# Patient Record
Sex: Female | Born: 1980 | Race: Black or African American | Hispanic: No | Marital: Single | Smoking: Never smoker
Health system: Southern US, Community
[De-identification: ages and names within clinical notes are randomized; demographics above are authoritative.]

## PROBLEM LIST (undated history)

## (undated) ENCOUNTER — Inpatient Hospital Stay (HOSPITAL_COMMUNITY): Payer: Self-pay

## (undated) DIAGNOSIS — R87629 Unspecified abnormal cytological findings in specimens from vagina: Secondary | ICD-10-CM

## (undated) DIAGNOSIS — N83209 Unspecified ovarian cyst, unspecified side: Secondary | ICD-10-CM

## (undated) DIAGNOSIS — O09529 Supervision of elderly multigravida, unspecified trimester: Secondary | ICD-10-CM

## (undated) DIAGNOSIS — N390 Urinary tract infection, site not specified: Secondary | ICD-10-CM

## (undated) DIAGNOSIS — D649 Anemia, unspecified: Secondary | ICD-10-CM

## (undated) DIAGNOSIS — T7491XA Unspecified adult maltreatment, confirmed, initial encounter: Secondary | ICD-10-CM

## (undated) HISTORY — PX: DILATION AND CURETTAGE OF UTERUS: SHX78

## (undated) HISTORY — DX: Unspecified adult maltreatment, confirmed, initial encounter: T74.91XA

## (undated) HISTORY — DX: Anemia, unspecified: D64.9

## (undated) HISTORY — DX: Unspecified abnormal cytological findings in specimens from vagina: R87.629

## (undated) HISTORY — DX: Supervision of elderly multigravida, unspecified trimester: O09.529

---
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
## 2000-03-11 ENCOUNTER — Encounter: Payer: Self-pay | Admitting: Emergency Medicine

## 2000-03-11 ENCOUNTER — Emergency Department (HOSPITAL_COMMUNITY): Admission: EM | Admit: 2000-03-11 | Discharge: 2000-03-11 | Payer: Self-pay | Admitting: Emergency Medicine

## 2001-02-21 ENCOUNTER — Emergency Department (HOSPITAL_COMMUNITY): Admission: EM | Admit: 2001-02-21 | Discharge: 2001-02-21 | Payer: Self-pay | Admitting: Emergency Medicine

## 2001-12-31 ENCOUNTER — Emergency Department (HOSPITAL_COMMUNITY): Admission: EM | Admit: 2001-12-31 | Discharge: 2001-12-31 | Payer: Self-pay | Admitting: Emergency Medicine

## 2002-04-26 ENCOUNTER — Emergency Department (HOSPITAL_COMMUNITY): Admission: EM | Admit: 2002-04-26 | Discharge: 2002-04-26 | Payer: Self-pay

## 2002-05-06 ENCOUNTER — Emergency Department (HOSPITAL_COMMUNITY): Admission: EM | Admit: 2002-05-06 | Discharge: 2002-05-06 | Payer: Self-pay | Admitting: *Deleted

## 2002-05-07 ENCOUNTER — Inpatient Hospital Stay (HOSPITAL_COMMUNITY): Admission: AD | Admit: 2002-05-07 | Discharge: 2002-05-07 | Payer: Self-pay | Admitting: *Deleted

## 2002-05-10 ENCOUNTER — Inpatient Hospital Stay (HOSPITAL_COMMUNITY): Admission: AD | Admit: 2002-05-10 | Discharge: 2002-05-10 | Payer: Self-pay | Admitting: *Deleted

## 2002-05-16 ENCOUNTER — Inpatient Hospital Stay (HOSPITAL_COMMUNITY): Admission: AD | Admit: 2002-05-16 | Discharge: 2002-05-16 | Payer: Self-pay | Admitting: *Deleted

## 2002-05-18 ENCOUNTER — Ambulatory Visit (HOSPITAL_COMMUNITY): Admission: RE | Admit: 2002-05-18 | Discharge: 2002-05-18 | Payer: Self-pay | Admitting: *Deleted

## 2002-05-29 ENCOUNTER — Encounter: Payer: Self-pay | Admitting: *Deleted

## 2002-05-29 ENCOUNTER — Inpatient Hospital Stay (HOSPITAL_COMMUNITY): Admission: AD | Admit: 2002-05-29 | Discharge: 2002-05-29 | Payer: Self-pay | Admitting: *Deleted

## 2002-06-14 ENCOUNTER — Inpatient Hospital Stay (HOSPITAL_COMMUNITY): Admission: AD | Admit: 2002-06-14 | Discharge: 2002-06-14 | Payer: Self-pay | Admitting: *Deleted

## 2002-07-11 ENCOUNTER — Inpatient Hospital Stay (HOSPITAL_COMMUNITY): Admission: AD | Admit: 2002-07-11 | Discharge: 2002-07-11 | Payer: Self-pay | Admitting: Obstetrics and Gynecology

## 2002-07-26 ENCOUNTER — Inpatient Hospital Stay (HOSPITAL_COMMUNITY): Admission: AD | Admit: 2002-07-26 | Discharge: 2002-07-26 | Payer: Self-pay | Admitting: *Deleted

## 2002-08-29 ENCOUNTER — Encounter: Payer: Self-pay | Admitting: *Deleted

## 2002-08-29 ENCOUNTER — Ambulatory Visit (HOSPITAL_COMMUNITY): Admission: RE | Admit: 2002-08-29 | Discharge: 2002-08-29 | Payer: Self-pay | Admitting: *Deleted

## 2002-09-10 ENCOUNTER — Inpatient Hospital Stay (HOSPITAL_COMMUNITY): Admission: AD | Admit: 2002-09-10 | Discharge: 2002-09-10 | Payer: Self-pay | Admitting: *Deleted

## 2002-10-01 ENCOUNTER — Inpatient Hospital Stay (HOSPITAL_COMMUNITY): Admission: AD | Admit: 2002-10-01 | Discharge: 2002-10-01 | Payer: Self-pay | Admitting: Obstetrics and Gynecology

## 2002-11-12 ENCOUNTER — Encounter: Payer: Self-pay | Admitting: Obstetrics and Gynecology

## 2002-11-12 ENCOUNTER — Ambulatory Visit (HOSPITAL_COMMUNITY): Admission: RE | Admit: 2002-11-12 | Discharge: 2002-11-12 | Payer: Self-pay | Admitting: Obstetrics and Gynecology

## 2002-12-09 ENCOUNTER — Inpatient Hospital Stay (HOSPITAL_COMMUNITY): Admission: AD | Admit: 2002-12-09 | Discharge: 2002-12-09 | Payer: Self-pay | Admitting: Obstetrics and Gynecology

## 2002-12-17 ENCOUNTER — Inpatient Hospital Stay: Admission: AD | Admit: 2002-12-17 | Discharge: 2002-12-17 | Payer: Self-pay | Admitting: Obstetrics and Gynecology

## 2003-01-11 ENCOUNTER — Inpatient Hospital Stay (HOSPITAL_COMMUNITY): Admission: AD | Admit: 2003-01-11 | Discharge: 2003-01-11 | Payer: Self-pay | Admitting: Obstetrics & Gynecology

## 2003-01-12 ENCOUNTER — Inpatient Hospital Stay (HOSPITAL_COMMUNITY): Admission: AD | Admit: 2003-01-12 | Discharge: 2003-01-14 | Payer: Self-pay | Admitting: Obstetrics & Gynecology

## 2003-03-14 ENCOUNTER — Inpatient Hospital Stay (HOSPITAL_COMMUNITY): Admission: AD | Admit: 2003-03-14 | Discharge: 2003-03-14 | Payer: Self-pay | Admitting: Obstetrics and Gynecology

## 2003-05-22 ENCOUNTER — Emergency Department (HOSPITAL_COMMUNITY): Admission: AD | Admit: 2003-05-22 | Discharge: 2003-05-22 | Payer: Self-pay | Admitting: Family Medicine

## 2003-06-21 ENCOUNTER — Inpatient Hospital Stay (HOSPITAL_COMMUNITY): Admission: AD | Admit: 2003-06-21 | Discharge: 2003-06-21 | Payer: Self-pay | Admitting: Obstetrics and Gynecology

## 2003-10-08 ENCOUNTER — Emergency Department (HOSPITAL_COMMUNITY): Admission: EM | Admit: 2003-10-08 | Discharge: 2003-10-08 | Payer: Self-pay | Admitting: Emergency Medicine

## 2003-10-11 ENCOUNTER — Inpatient Hospital Stay (HOSPITAL_COMMUNITY): Admission: AD | Admit: 2003-10-11 | Discharge: 2003-10-11 | Payer: Self-pay | Admitting: Obstetrics & Gynecology

## 2003-11-30 ENCOUNTER — Inpatient Hospital Stay (HOSPITAL_COMMUNITY): Admission: AD | Admit: 2003-11-30 | Discharge: 2003-11-30 | Payer: Self-pay | Admitting: Obstetrics and Gynecology

## 2003-11-30 ENCOUNTER — Encounter (INDEPENDENT_AMBULATORY_CARE_PROVIDER_SITE_OTHER): Payer: Self-pay | Admitting: Specialist

## 2003-11-30 ENCOUNTER — Observation Stay (HOSPITAL_COMMUNITY): Admission: AD | Admit: 2003-11-30 | Discharge: 2003-12-01 | Payer: Self-pay | Admitting: Family Medicine

## 2004-03-09 ENCOUNTER — Inpatient Hospital Stay (HOSPITAL_COMMUNITY): Admission: AD | Admit: 2004-03-09 | Discharge: 2004-03-10 | Payer: Self-pay | Admitting: Family Medicine

## 2004-05-13 ENCOUNTER — Inpatient Hospital Stay (HOSPITAL_COMMUNITY): Admission: AD | Admit: 2004-05-13 | Discharge: 2004-05-14 | Payer: Self-pay | Admitting: Obstetrics & Gynecology

## 2004-08-06 ENCOUNTER — Emergency Department (HOSPITAL_COMMUNITY): Admission: EM | Admit: 2004-08-06 | Discharge: 2004-08-06 | Payer: Self-pay | Admitting: Emergency Medicine

## 2005-05-10 ENCOUNTER — Inpatient Hospital Stay (HOSPITAL_COMMUNITY): Admission: AD | Admit: 2005-05-10 | Discharge: 2005-05-10 | Payer: Self-pay | Admitting: Obstetrics and Gynecology

## 2005-06-17 ENCOUNTER — Inpatient Hospital Stay (HOSPITAL_COMMUNITY): Admission: AD | Admit: 2005-06-17 | Discharge: 2005-06-17 | Payer: Self-pay | Admitting: Obstetrics & Gynecology

## 2005-06-18 ENCOUNTER — Encounter (INDEPENDENT_AMBULATORY_CARE_PROVIDER_SITE_OTHER): Payer: Self-pay | Admitting: *Deleted

## 2005-06-18 ENCOUNTER — Ambulatory Visit (HOSPITAL_COMMUNITY): Admission: AD | Admit: 2005-06-18 | Discharge: 2005-06-18 | Payer: Self-pay | Admitting: Obstetrics and Gynecology

## 2005-08-15 ENCOUNTER — Emergency Department (HOSPITAL_COMMUNITY): Admission: EM | Admit: 2005-08-15 | Discharge: 2005-08-15 | Payer: Self-pay | Admitting: Emergency Medicine

## 2005-08-25 ENCOUNTER — Emergency Department (HOSPITAL_COMMUNITY): Admission: EM | Admit: 2005-08-25 | Discharge: 2005-08-25 | Payer: Self-pay | Admitting: Emergency Medicine

## 2005-09-27 ENCOUNTER — Emergency Department (HOSPITAL_COMMUNITY): Admission: EM | Admit: 2005-09-27 | Discharge: 2005-09-27 | Payer: Self-pay | Admitting: Emergency Medicine

## 2005-10-29 ENCOUNTER — Emergency Department (HOSPITAL_COMMUNITY): Admission: EM | Admit: 2005-10-29 | Discharge: 2005-10-30 | Payer: Self-pay | Admitting: Emergency Medicine

## 2007-07-12 ENCOUNTER — Emergency Department (HOSPITAL_COMMUNITY): Admission: EM | Admit: 2007-07-12 | Discharge: 2007-07-12 | Payer: Self-pay | Admitting: Emergency Medicine

## 2008-04-26 ENCOUNTER — Emergency Department (HOSPITAL_COMMUNITY): Admission: EM | Admit: 2008-04-26 | Discharge: 2008-04-26 | Payer: Self-pay | Admitting: Family Medicine

## 2008-08-06 ENCOUNTER — Emergency Department (HOSPITAL_COMMUNITY): Admission: EM | Admit: 2008-08-06 | Discharge: 2008-08-06 | Payer: Self-pay | Admitting: Emergency Medicine

## 2008-12-30 ENCOUNTER — Emergency Department (HOSPITAL_COMMUNITY): Admission: EM | Admit: 2008-12-30 | Discharge: 2008-12-30 | Payer: Self-pay | Admitting: Emergency Medicine

## 2009-05-28 ENCOUNTER — Emergency Department (HOSPITAL_COMMUNITY): Admission: EM | Admit: 2009-05-28 | Discharge: 2009-05-28 | Payer: Self-pay | Admitting: Diagnostic Radiology

## 2009-08-26 ENCOUNTER — Inpatient Hospital Stay (HOSPITAL_COMMUNITY): Admission: AD | Admit: 2009-08-26 | Discharge: 2009-08-27 | Payer: Self-pay | Admitting: Obstetrics & Gynecology

## 2009-12-31 ENCOUNTER — Emergency Department (HOSPITAL_COMMUNITY): Admission: EM | Admit: 2009-12-31 | Discharge: 2009-12-31 | Payer: Self-pay | Admitting: Emergency Medicine

## 2010-03-12 ENCOUNTER — Inpatient Hospital Stay (HOSPITAL_COMMUNITY)
Admission: AD | Admit: 2010-03-12 | Discharge: 2010-03-12 | Payer: Self-pay | Source: Home / Self Care | Admitting: Family Medicine

## 2010-03-12 ENCOUNTER — Ambulatory Visit: Payer: Self-pay | Admitting: Obstetrics and Gynecology

## 2010-03-26 ENCOUNTER — Ambulatory Visit: Payer: Self-pay | Admitting: Gynecology

## 2010-03-26 ENCOUNTER — Inpatient Hospital Stay (HOSPITAL_COMMUNITY)
Admission: AD | Admit: 2010-03-26 | Discharge: 2010-03-26 | Payer: Self-pay | Source: Home / Self Care | Admitting: Obstetrics & Gynecology

## 2010-04-13 ENCOUNTER — Ambulatory Visit: Payer: Self-pay | Admitting: Obstetrics and Gynecology

## 2010-04-13 ENCOUNTER — Inpatient Hospital Stay (HOSPITAL_COMMUNITY)
Admission: AD | Admit: 2010-04-13 | Discharge: 2010-04-13 | Payer: Self-pay | Source: Home / Self Care | Admitting: Obstetrics & Gynecology

## 2010-04-16 ENCOUNTER — Ambulatory Visit: Payer: Self-pay | Admitting: Gynecology

## 2010-04-16 ENCOUNTER — Inpatient Hospital Stay (HOSPITAL_COMMUNITY)
Admission: AD | Admit: 2010-04-16 | Discharge: 2010-04-16 | Payer: Self-pay | Source: Home / Self Care | Admitting: Obstetrics and Gynecology

## 2010-04-25 ENCOUNTER — Inpatient Hospital Stay (HOSPITAL_COMMUNITY): Admission: AD | Admit: 2010-04-25 | Discharge: 2010-04-25 | Payer: Self-pay | Admitting: Obstetrics & Gynecology

## 2010-04-25 ENCOUNTER — Ambulatory Visit: Payer: Self-pay | Admitting: Obstetrics & Gynecology

## 2010-05-18 ENCOUNTER — Ambulatory Visit: Payer: Self-pay | Admitting: Family

## 2010-05-18 ENCOUNTER — Inpatient Hospital Stay (HOSPITAL_COMMUNITY): Admission: AD | Admit: 2010-05-18 | Discharge: 2010-05-18 | Payer: Self-pay | Admitting: Obstetrics & Gynecology

## 2010-07-03 ENCOUNTER — Ambulatory Visit (HOSPITAL_COMMUNITY)
Admission: RE | Admit: 2010-07-03 | Discharge: 2010-07-03 | Payer: Self-pay | Source: Home / Self Care | Attending: Family Medicine | Admitting: Family Medicine

## 2010-07-03 ENCOUNTER — Encounter: Payer: Self-pay | Admitting: Family Medicine

## 2010-08-06 ENCOUNTER — Inpatient Hospital Stay (HOSPITAL_COMMUNITY)
Admission: AD | Admit: 2010-08-06 | Discharge: 2010-08-06 | Payer: Self-pay | Source: Home / Self Care | Attending: Obstetrics and Gynecology | Admitting: Obstetrics and Gynecology

## 2010-08-07 ENCOUNTER — Inpatient Hospital Stay (HOSPITAL_COMMUNITY)
Admission: AD | Admit: 2010-08-07 | Discharge: 2010-08-07 | Payer: Self-pay | Source: Home / Self Care | Attending: Obstetrics & Gynecology | Admitting: Obstetrics & Gynecology

## 2010-08-10 LAB — WET PREP, GENITAL
Trich, Wet Prep: NONE SEEN
Yeast Wet Prep HPF POC: NONE SEEN

## 2010-08-10 LAB — URINALYSIS, ROUTINE W REFLEX MICROSCOPIC
Bilirubin Urine: NEGATIVE
Hgb urine dipstick: NEGATIVE
Ketones, ur: NEGATIVE mg/dL
Nitrite: NEGATIVE
Protein, ur: NEGATIVE mg/dL
Specific Gravity, Urine: <1.005 — ABNORMAL LOW (ref 1.005–1.030)
Urine Glucose, Fasting: NEGATIVE mg/dL
Urobilinogen, UA: 0.2 mg/dL (ref 0.0–1.0)
pH: 6 (ref 5.0–8.0)

## 2010-08-10 LAB — KLEIHAUER-BETKE STAIN
Fetal Cells %: 0 %
Quantitation Fetal Hemoglobin: 0 mL

## 2010-08-10 LAB — TYPE AND SCREEN
ABO/RH(D): O POS
Antibody Screen: NEGATIVE

## 2010-08-24 ENCOUNTER — Inpatient Hospital Stay (HOSPITAL_COMMUNITY)
Admission: AD | Admit: 2010-08-24 | Discharge: 2010-08-24 | Payer: Self-pay | Source: Home / Self Care | Attending: Family Medicine | Admitting: Family Medicine

## 2010-08-24 LAB — WET PREP, GENITAL
Clue Cells Wet Prep HPF POC: NONE SEEN
Trich, Wet Prep: NONE SEEN
Yeast Wet Prep HPF POC: NONE SEEN

## 2010-08-24 LAB — URINALYSIS, ROUTINE W REFLEX MICROSCOPIC
Bilirubin Urine: NEGATIVE
Hgb urine dipstick: NEGATIVE
Ketones, ur: NEGATIVE mg/dL
Nitrite: NEGATIVE
Protein, ur: NEGATIVE mg/dL
Specific Gravity, Urine: 1.02 (ref 1.005–1.030)
Urine Glucose, Fasting: NEGATIVE mg/dL
Urobilinogen, UA: 0.2 mg/dL (ref 0.0–1.0)
pH: 6.5 (ref 5.0–8.0)

## 2010-10-07 LAB — URINALYSIS, ROUTINE W REFLEX MICROSCOPIC
Bilirubin Urine: NEGATIVE
Hgb urine dipstick: NEGATIVE
Protein, ur: NEGATIVE mg/dL
Urobilinogen, UA: 0.2 mg/dL (ref 0.0–1.0)

## 2010-10-08 LAB — URINALYSIS, ROUTINE W REFLEX MICROSCOPIC
Glucose, UA: NEGATIVE mg/dL
Ketones, ur: NEGATIVE mg/dL
Leukocytes, UA: NEGATIVE
Nitrite: NEGATIVE
Protein, ur: NEGATIVE mg/dL
Specific Gravity, Urine: >1.03 — ABNORMAL HIGH (ref 1.005–1.030)
Urobilinogen, UA: 0.2 mg/dL (ref 0.0–1.0)
pH: 6 (ref 5.0–8.0)
pH: 6.5 (ref 5.0–8.0)

## 2010-10-08 LAB — URINE MICROSCOPIC-ADD ON

## 2010-10-08 LAB — COMPREHENSIVE METABOLIC PANEL
Alkaline Phosphatase: 41 U/L (ref 39–117)
BUN: 9 mg/dL (ref 6–23)
Glucose, Bld: 88 mg/dL (ref 70–99)
Potassium: 3.6 mEq/L (ref 3.5–5.1)
Total Protein: 6.9 g/dL (ref 6.0–8.3)

## 2010-10-08 LAB — CBC
HCT: 35.8 % — ABNORMAL LOW (ref 36.0–46.0)
MCHC: 33.1 g/dL (ref 30.0–36.0)
MCV: 81.4 fL (ref 78.0–100.0)
RDW: 16.1 % — ABNORMAL HIGH (ref 11.5–15.5)
WBC: 8.1 10*3/uL (ref 4.0–10.5)

## 2010-10-08 LAB — LIPASE, BLOOD: Lipase: 31 U/L (ref 11–59)

## 2010-10-08 LAB — WET PREP, GENITAL: Yeast Wet Prep HPF POC: NONE SEEN

## 2010-10-09 LAB — CBC
HCT: 33.8 % — ABNORMAL LOW (ref 36.0–46.0)
MCH: 26.1 pg (ref 26.0–34.0)
MCV: 83.2 fL (ref 78.0–100.0)
Platelets: 214 10*3/uL (ref 150–400)
RDW: 16.6 % — ABNORMAL HIGH (ref 11.5–15.5)

## 2010-10-09 LAB — GC/CHLAMYDIA PROBE AMP, GENITAL: GC Probe Amp, Genital: NEGATIVE

## 2010-10-09 LAB — WET PREP, GENITAL
Trich, Wet Prep: NONE SEEN
Yeast Wet Prep HPF POC: NONE SEEN

## 2010-10-09 LAB — URINALYSIS, ROUTINE W REFLEX MICROSCOPIC
Glucose, UA: NEGATIVE mg/dL
Hgb urine dipstick: NEGATIVE
Specific Gravity, Urine: <1.005 — ABNORMAL LOW (ref 1.005–1.030)
pH: 5.5 (ref 5.0–8.0)

## 2010-10-12 LAB — RPR: RPR Ser Ql: NONREACTIVE

## 2010-10-12 LAB — BASIC METABOLIC PANEL
BUN: 11 mg/dL (ref 6–23)
Creatinine, Ser: 0.88 mg/dL (ref 0.4–1.2)
GFR calc non Af Amer: >60 mL/min (ref >60)
Potassium: 3.5 mEq/L (ref 3.5–5.1)

## 2010-10-12 LAB — CBC
Platelets: 228 10*3/uL (ref 150–400)
WBC: 4.4 10*3/uL (ref 4.0–10.5)

## 2010-10-12 LAB — WET PREP, GENITAL: Clue Cells Wet Prep HPF POC: NONE SEEN

## 2010-10-12 LAB — URINALYSIS, ROUTINE W REFLEX MICROSCOPIC
Bilirubin Urine: NEGATIVE
Glucose, UA: NEGATIVE mg/dL
Protein, ur: NEGATIVE mg/dL
Specific Gravity, Urine: >1.03 — ABNORMAL HIGH (ref 1.005–1.030)
Urobilinogen, UA: 0.2 mg/dL (ref 0.0–1.0)

## 2010-10-12 LAB — URINE MICROSCOPIC-ADD ON

## 2010-10-12 LAB — GC/CHLAMYDIA PROBE AMP, GENITAL
Chlamydia, DNA Probe: NEGATIVE
GC Probe Amp, Genital: NEGATIVE

## 2010-10-12 LAB — DIFFERENTIAL
Lymphocytes Relative: 35 % (ref 12–46)
Lymphs Abs: 1.5 10*3/uL (ref 0.7–4.0)
Neutro Abs: 2.4 10*3/uL (ref 1.7–7.7)
Neutrophils Relative %: 55 % (ref 43–77)

## 2010-10-12 LAB — POCT PREGNANCY, URINE: Preg Test, Ur: NEGATIVE

## 2010-10-16 ENCOUNTER — Inpatient Hospital Stay (HOSPITAL_COMMUNITY)
Admission: AD | Admit: 2010-10-16 | Discharge: 2010-10-16 | Disposition: A | Discharge status: Home / Self Care | Payer: Medicaid Other | Source: Ambulatory Visit | Attending: Obstetrics & Gynecology | Admitting: Obstetrics & Gynecology

## 2010-10-16 DIAGNOSIS — O479 False labor, unspecified: Secondary | ICD-10-CM

## 2010-10-16 LAB — CBC
Hemoglobin: 10.1 g/dL — ABNORMAL LOW (ref 12.0–15.0)
MCHC: 33.2 g/dL (ref 30.0–36.0)
Platelets: 235 10*3/uL (ref 150–400)
RDW: 15.9 % — ABNORMAL HIGH (ref 11.5–15.5)

## 2010-10-16 LAB — URINE MICROSCOPIC-ADD ON

## 2010-10-16 LAB — URINALYSIS, ROUTINE W REFLEX MICROSCOPIC
Glucose, UA: NEGATIVE mg/dL
Glucose, UA: NEGATIVE mg/dL
Ketones, ur: 15 mg/dL — AB
Leukocytes, UA: NEGATIVE
Nitrite: NEGATIVE
Protein, ur: NEGATIVE mg/dL
Specific Gravity, Urine: 1.02 (ref 1.005–1.030)
pH: 6 (ref 5.0–8.0)
pH: 6.5 (ref 5.0–8.0)

## 2010-10-16 LAB — GC/CHLAMYDIA PROBE AMP, GENITAL
Chlamydia, DNA Probe: NEGATIVE
GC Probe Amp, Genital: NEGATIVE

## 2010-10-16 LAB — WET PREP, GENITAL: Yeast Wet Prep HPF POC: NONE SEEN

## 2010-10-22 ENCOUNTER — Other Ambulatory Visit: Payer: Self-pay | Admitting: Obstetrics & Gynecology

## 2010-10-22 ENCOUNTER — Ambulatory Visit (HOSPITAL_COMMUNITY)
Admission: RE | Admit: 2010-10-22 | Discharge: 2010-10-22 | Disposition: A | Discharge status: Home / Self Care | Payer: Medicaid Other | Source: Ambulatory Visit | Attending: Obstetrics & Gynecology | Admitting: Obstetrics & Gynecology

## 2010-10-22 DIAGNOSIS — O288 Other abnormal findings on antenatal screening of mother: Secondary | ICD-10-CM

## 2010-10-22 DIAGNOSIS — Z3689 Encounter for other specified antenatal screening: Secondary | ICD-10-CM | POA: Insufficient documentation

## 2010-10-22 DIAGNOSIS — O139 Gestational [pregnancy-induced] hypertension without significant proteinuria, unspecified trimester: Secondary | ICD-10-CM | POA: Insufficient documentation

## 2010-10-25 ENCOUNTER — Inpatient Hospital Stay (HOSPITAL_COMMUNITY)
Admission: AD | Admit: 2010-10-25 | Discharge: 2010-10-25 | Disposition: A | Discharge status: Home / Self Care | Payer: Medicaid Other | Source: Ambulatory Visit | Attending: Obstetrics & Gynecology | Admitting: Obstetrics & Gynecology

## 2010-10-25 DIAGNOSIS — O479 False labor, unspecified: Secondary | ICD-10-CM | POA: Insufficient documentation

## 2010-10-27 ENCOUNTER — Inpatient Hospital Stay (HOSPITAL_COMMUNITY)
Admission: AD | Admit: 2010-10-27 | Discharge: 2010-10-27 | Disposition: A | Discharge status: Home / Self Care | Payer: Medicaid Other | Source: Ambulatory Visit | Attending: Obstetrics & Gynecology | Admitting: Obstetrics & Gynecology

## 2010-10-27 DIAGNOSIS — O479 False labor, unspecified: Secondary | ICD-10-CM | POA: Insufficient documentation

## 2010-10-29 ENCOUNTER — Inpatient Hospital Stay (HOSPITAL_COMMUNITY): Admission: AD | Admit: 2010-10-29 | Payer: Self-pay | Source: Ambulatory Visit | Admitting: Obstetrics & Gynecology

## 2010-10-29 ENCOUNTER — Inpatient Hospital Stay (HOSPITAL_COMMUNITY)
Admission: AD | Admit: 2010-10-29 | Discharge: 2010-11-01 | DRG: 775 | Disposition: A | Discharge status: Home / Self Care | Payer: Medicaid Other | Source: Ambulatory Visit | Attending: Obstetrics & Gynecology | Admitting: Obstetrics & Gynecology

## 2010-10-29 DIAGNOSIS — O4290 Premature rupture of membranes, unspecified as to length of time between rupture and onset of labor, unspecified weeks of gestation: Principal | ICD-10-CM | POA: Diagnosis present

## 2010-10-29 LAB — CBC
HCT: 33.6 % — ABNORMAL LOW (ref 36.0–46.0)
Hemoglobin: 10.1 g/dL — ABNORMAL LOW (ref 12.0–15.0)
MCHC: 30.1 g/dL (ref 30.0–36.0)
MCV: 76.4 fL — ABNORMAL LOW (ref 78.0–100.0)

## 2010-10-30 DIAGNOSIS — O4290 Premature rupture of membranes, unspecified as to length of time between rupture and onset of labor, unspecified weeks of gestation: Secondary | ICD-10-CM

## 2010-12-11 NOTE — Op Note (Signed)
Teresa, Glenn        ACCOUNT NO.:  [id number]   MEDICAL RECORD NO.:  [id number]          PATIENT TYPE:  AMB   LOCATION:  SDC                           FACILITY:  [facility]   PHYSICIAN:  Miguel Aschoff, M.D.       DATE OF BIRTH:  09-27-1980   DATE OF PROCEDURE:  06/18/2005  DATE OF DISCHARGE:                                 OPERATIVE REPORT   PREOPERATIVE DIAGNOSIS:  Missed abortion.   POSTOPERATIVE DIAGNOSIS:  Missed abortion.   PROCEDURE:  Suction dilatation and curettage.   SURGEON:  Miguel Aschoff, M.D.   ANESTHESIA:  IV sedation with paracervical block.   COMPLICATIONS:  None.   JUSTIFICATION:  The patient is a 30 year old black female gravida 3, para 1-  0-2-1, noted be approximately 10 weeks by dates.  The patient underwent an  ultrasound exam on November 22, and was noted to have 10-week pregnancy with  no fetal heart activity.  Because of the fetal demise, she is now being  taken to the operating room to undergo evacuation of the uterus via suction  D&C.  The risks and benefits of the procedure were discussed with the  patient.   PROCEDURE:  The patient was taken to the operating room, placed in a supine  position, and IV sedation was administered without difficulty.  She was then  placed in the dorsal lithotomy position, prepped and draped in the usual  sterile fashion.  The bladder was catheterized.  At this point a speculum  was placed in the vaginal vault and the anterior cervical lip was grasped  with tenaculum and then the cervix was injected with 10 mL of 1% Xylocaine.  Then the endocervical canal was dilated using serial Pratt dilators until a  #31 Pratt dilator could be passed.  Then using a #10 vacuum curette, the  contents of the uterus were evacuated without difficulty.  Following this,  sharp curettage was carried out which showed a small amount of additional  tissue.  A final pass was made with the vacuum curette.  No additional  tissue was obtained.  At  this point all instruments were removed.  Hemostasis was readily achieved.  The patient was taken out of the lithotomy  position and brought to the recovery room in satisfactory condition.   The plan is for the patient to be discharged home.  Medications for home  include Darvocet N-100 one every four hours need for pain, and doxycycline  100 mg twice a day x3 days.  She will be seen back in our office for follow-  up  examination.  She is to call for any problems such as fever, pain or heavy  bleeding.  In addition, due to the patient having to second trimester  pregnancy losses, she is to undergo evaluation as an outpatient to rule out  the presence of the anticardiolipin antibody.      Miguel Aschoff, M.D.  Electronically Signed     AR/MEDQ  D:  06/18/2005  T:  06/18/2005  Job:  [id number]

## 2010-12-11 NOTE — Discharge Summary (Signed)
NAME:  Teresa Glenn, Teresa Glenn                  ACCOUNT NO.:  [id number]   MEDICAL RECORD NO.:  [id number]                   PATIENT TYPE:  OBV   LOCATION:  9319                                 FACILITY:  [facility]   PHYSICIAN:  Maylon Peppers. Waynette Buttery, M.D.               DATE OF BIRTH:  31-Jul-1980   DATE OF ADMISSION:  11/30/2003  DATE OF DISCHARGE:  12/01/2003                                 DISCHARGE SUMMARY   MEDICATIONS AT DISCHARGE INCLUDE:  Ibuprofen 600 mg p.o. q.6 hours.   DISCHARGE DIAGNOSES:  1. Status post spontaneous abortion at approximately 14 weeks.  2. Retained placenta.   DIET:  No restrictions.   SPECIAL INSTRUCTIONS:  Patient is to not get pregnant for at least three  weeks.   FOLLOW UP:  She is to return to the MAU if she has severe bleeding, cramping  or fevers this week.   HOSPITAL COURSE:  Ms. Patnode is a 30 year old G2, now P1011, who  presented to the MAU secondary to having had severe cramps and delivering  the fetus at home.  She arrived at the MAU by EMS.  Fetus and cord were  attached but the placenta had not yet delivered.  The patient's placenta was  delivered by Dr. Shawnie Pons in the MAU and she had some retained placenta in her  vagina and cervix which were removed with ring forceps, again by Dr. Shawnie Pons.  She was watched over night, given Cytotec and Methergine as well as pain  control and by morning was doing well and ready for discharge.  She was  discharged after her last dose of Methergine.                                               Maylon Peppers Waynette Buttery, M.D.    SAG/MEDQ  D:  12/01/2003  T:  12/01/2003  Job:  [id number]

## 2011-04-26 LAB — POCT PREGNANCY, URINE: Preg Test, Ur: NEGATIVE

## 2011-04-30 LAB — POCT PREGNANCY, URINE
Operator id: 231701
Preg Test, Ur: NEGATIVE

## 2011-04-30 LAB — URINALYSIS, ROUTINE W REFLEX MICROSCOPIC
Glucose, UA: NEGATIVE
Protein, ur: NEGATIVE
Specific Gravity, Urine: 1.018
Urobilinogen, UA: 0.2

## 2012-05-19 ENCOUNTER — Encounter (HOSPITAL_COMMUNITY): Payer: Self-pay | Admitting: *Deleted

## 2012-05-19 ENCOUNTER — Inpatient Hospital Stay (HOSPITAL_COMMUNITY): Payer: Medicaid Other

## 2012-05-19 ENCOUNTER — Inpatient Hospital Stay (HOSPITAL_COMMUNITY)
Admission: AD | Admit: 2012-05-19 | Discharge: 2012-05-19 | Disposition: A | Discharge status: Home / Self Care | Payer: Medicaid Other | Source: Ambulatory Visit | Attending: Obstetrics & Gynecology | Admitting: Obstetrics & Gynecology

## 2012-05-19 DIAGNOSIS — D649 Anemia, unspecified: Secondary | ICD-10-CM

## 2012-05-19 DIAGNOSIS — O99891 Other specified diseases and conditions complicating pregnancy: Secondary | ICD-10-CM | POA: Insufficient documentation

## 2012-05-19 DIAGNOSIS — O99019 Anemia complicating pregnancy, unspecified trimester: Secondary | ICD-10-CM | POA: Insufficient documentation

## 2012-05-19 DIAGNOSIS — D509 Iron deficiency anemia, unspecified: Secondary | ICD-10-CM | POA: Insufficient documentation

## 2012-05-19 DIAGNOSIS — Z331 Pregnant state, incidental: Secondary | ICD-10-CM

## 2012-05-19 DIAGNOSIS — R0602 Shortness of breath: Secondary | ICD-10-CM | POA: Insufficient documentation

## 2012-05-19 DIAGNOSIS — R079 Chest pain, unspecified: Secondary | ICD-10-CM | POA: Insufficient documentation

## 2012-05-19 DIAGNOSIS — R1031 Right lower quadrant pain: Secondary | ICD-10-CM | POA: Insufficient documentation

## 2012-05-19 DIAGNOSIS — Z3490 Encounter for supervision of normal pregnancy, unspecified, unspecified trimester: Secondary | ICD-10-CM

## 2012-05-19 DIAGNOSIS — F419 Anxiety disorder, unspecified: Secondary | ICD-10-CM

## 2012-05-19 LAB — URINALYSIS, ROUTINE W REFLEX MICROSCOPIC
Bilirubin Urine: NEGATIVE
Hgb urine dipstick: NEGATIVE
Protein, ur: NEGATIVE mg/dL
Urobilinogen, UA: 1 mg/dL (ref 0.0–1.0)

## 2012-05-19 LAB — WET PREP, GENITAL: Yeast Wet Prep HPF POC: NONE SEEN

## 2012-05-19 LAB — CBC
HCT: 33.3 % — ABNORMAL LOW (ref 36.0–46.0)
MCHC: 31.5 g/dL (ref 30.0–36.0)
MCV: 79.9 fL (ref 78.0–100.0)
RDW: 15.3 % (ref 11.5–15.5)

## 2012-05-19 NOTE — MAU Note (Signed)
Patient states for the past two weeks she has had episodes of shortness of breath, feeling weak and chest pains. States this morning was worse but has subscided a little now. Denies abdominal pain or bleeding, no nausea or vomiting.

## 2012-05-19 NOTE — MAU Provider Note (Signed)
History     CSN: [id number]  Arrival date and time: 05/19/12 1144   First Provider Initiated Contact with Patient 05/19/12 1425      Chief Complaint  Patient presents with  . Shortness of Breath   Shortness of Breath This is a recurrent problem. The current episode started 1 to 4 weeks ago. The problem occurs intermittently. The problem has been gradually worsening. The average episode lasts 2 hours. Associated symptoms include abdominal pain and chest pain. Pertinent negatives include no fever, headaches, rash, vomiting or wheezing. Nothing aggravates the symptoms. She has tried nothing for the symptoms. There is no history of asthma or bronchiolitis.   Patient states she was at work this morning and had an episode of increasing SOB.  Patient did not attempt any treatment for relief.  She has had previous episodes over the past 2 weeks, but they have been accompanied by chest pain starting this past week.  The pain is described as a sharp, stabbing over the right side of the sternum.  She denies having asthma or any other respiratory conditions including recent illnesses.  The SOB has increased in intensity, with today being the worst episode she has experienced.  Patient also states she has RLQ pain.  Pain is sporadic, and is described as being crampy and resolves within a few minutes.    Past Medical History  Diagnosis Date  . No pertinent past medical history     Past Surgical History  Procedure Date  . Dilation and curettage of uterus     History reviewed. No pertinent family history.  History  Substance Use Topics  . Smoking status: Never Smoker   . Smokeless tobacco: Not on file  . Alcohol Use: No    Allergies: Allergies not on file  No prescriptions prior to admission   Review of Systems  Constitutional: Negative for fever and chills.  Eyes: Positive for blurred vision.  Respiratory: Positive for shortness of breath. Negative for cough and wheezing.     Cardiovascular: Positive for chest pain and palpitations.  Gastrointestinal: Positive for abdominal pain. Negative for nausea, vomiting, diarrhea and constipation.  Skin: Negative for itching and rash.  Neurological: Positive for dizziness. Negative for tingling and headaches.   Physical Exam   Blood pressure 104/66, pulse 66, temperature 97.9 F (36.6 C), temperature source Oral, resp. rate 18, height 5' 5.5" (1.664 m), weight 56.427 kg (124 lb 6.4 oz), last menstrual period 03/26/2012, SpO2 100.00%.  Physical Exam  Nursing note and vitals reviewed. Constitutional: She is oriented to person, place, and time. She appears well-developed and well-nourished.  HENT:  Head: Normocephalic and atraumatic.  Cardiovascular: Normal rate, regular rhythm, normal heart sounds and intact distal pulses.  Exam reveals no gallop and no friction rub.   No murmur heard. Respiratory: Effort normal and breath sounds normal. No respiratory distress. She has no wheezes. She has no rales.  Neurological: She is alert and oriented to person, place, and time.  Skin: Skin is warm and dry.  Psychiatric: She has a normal mood and affect. Her behavior is normal. Judgment and thought content normal.   Results for orders placed during the hospital encounter of 05/19/12 (from the past 24 hour(s))  URINALYSIS, ROUTINE W REFLEX MICROSCOPIC     Status: Normal   Collection Time   05/19/12 12:31 PM      Component Value Range   Color, Urine YELLOW  YELLOW   APPearance CLEAR  CLEAR   Specific Gravity, Urine 1.025  1.005 - 1.030   pH 6.5  5.0 - 8.0   Glucose, UA NEGATIVE  NEGATIVE mg/dL   Hgb urine dipstick NEGATIVE  NEGATIVE   Bilirubin Urine NEGATIVE  NEGATIVE   Ketones, ur NEGATIVE  NEGATIVE mg/dL   Protein, ur NEGATIVE  NEGATIVE mg/dL   Urobilinogen, UA 1.0  0.0 - 1.0 mg/dL   Nitrite NEGATIVE  NEGATIVE   Leukocytes, UA NEGATIVE  NEGATIVE  POCT PREGNANCY, URINE     Status: Abnormal   Collection Time   05/19/12  12:38 PM      Component Value Range   Preg Test, Ur POSITIVE (*) NEGATIVE  WET PREP, GENITAL     Status: Abnormal   Collection Time   05/19/12  3:09 PM      Component Value Range   Yeast Wet Prep HPF POC NONE SEEN  NONE SEEN   Trich, Wet Prep NONE SEEN  NONE SEEN   Clue Cells Wet Prep HPF POC NONE SEEN  NONE SEEN   WBC, Wet Prep HPF POC FEW (*) NONE SEEN  CBC     Status: Abnormal   Collection Time   05/19/12  3:20 PM      Component Value Range   WBC 7.9  4.0 - 10.5 K/uL   RBC 4.17  3.87 - 5.11 MIL/uL   Hemoglobin 10.5 (*) 12.0 - 15.0 g/dL   HCT 16.1 (*) 09.6 - 04.5 %   MCV 79.9  78.0 - 100.0 fL   MCH 25.2 (*) 26.0 - 34.0 pg   MCHC 31.5  30.0 - 36.0 g/dL   RDW 40.9  81.1 - 91.4 %   Platelets 254  150 - 400 K/uL      EKG: Normal sinus rhythm, nonspecific T wave abnormality.Marland Kitchen RATE 63  MAU Course  Procedures  GC/Chlamydia to lab  MDM  Student discussed results with the patient.  Patient agrees to discuss anxiety, if persists with her OB GYN. Assessment and Plan  A: viable IUP at 8 weeks Chest pain, negative EKG Recent history of anxiety RLQ pain in pregnancy Anemia, most likely iron deficiency  P: encourage to keep scheduled appointment at [facility] Department, encourage to discuss anxiety with OB Encourage prenatal vitamins Return for worsening symptoms  Nira Conn 05/19/2012, 2:27 PM \   I have reviewed the HPI, labs , observed the student's exam and agree with his findings.

## 2012-05-20 LAB — GC/CHLAMYDIA PROBE AMP, GENITAL
Chlamydia, DNA Probe: NEGATIVE
GC Probe Amp, Genital: NEGATIVE

## 2013-03-24 ENCOUNTER — Encounter (HOSPITAL_COMMUNITY): Payer: Self-pay | Admitting: *Deleted

## 2013-05-13 ENCOUNTER — Emergency Department (HOSPITAL_COMMUNITY)
Admission: EM | Admit: 2013-05-13 | Discharge: 2013-05-13 | Disposition: A | Discharge status: Home / Self Care | Payer: Medicaid Other | Attending: Emergency Medicine | Admitting: Emergency Medicine

## 2013-05-13 ENCOUNTER — Encounter (HOSPITAL_COMMUNITY): Payer: Self-pay | Admitting: Emergency Medicine

## 2013-05-13 DIAGNOSIS — R51 Headache: Secondary | ICD-10-CM | POA: Insufficient documentation

## 2013-05-13 DIAGNOSIS — J329 Chronic sinusitis, unspecified: Secondary | ICD-10-CM | POA: Insufficient documentation

## 2013-05-13 DIAGNOSIS — K089 Disorder of teeth and supporting structures, unspecified: Secondary | ICD-10-CM | POA: Insufficient documentation

## 2013-05-13 MED ORDER — HYDROCODONE-ACETAMINOPHEN 5-325 MG PO TABS: 1.0000 | ORAL_TABLET | Freq: Once | ORAL | Status: DC

## 2013-05-13 MED ORDER — AMOXICILLIN 500 MG PO CAPS: 1000.0000 mg | ORAL_CAPSULE | Freq: Two times a day (BID) | ORAL | Status: DC

## 2013-05-13 MED ORDER — HYDROCODONE-ACETAMINOPHEN 5-325 MG PO TABS
1.0000 | ORAL_TABLET | Freq: Once | ORAL | Status: AC
  Administered 2013-05-13: 1 via ORAL
  Filled 2013-05-13: qty 1

## 2013-05-13 MED ORDER — TRIAMCINOLONE ACETONIDE(NASAL) 55 MCG/ACT NA INHA: 2.0000 | Freq: Every day | NASAL | Status: DC

## 2013-05-13 NOTE — ED Provider Notes (Signed)
CSN: [id number]     Arrival date & time 05/13/13  2247 History   First MD Initiated Contact with Patient 05/13/13 2313    This chart was scribed for non-physician practitioner, Elpidio Anis PA-C, working with Loren Racer, MD by Arlan Organ, ED Scribe. This patient was seen in room WTR7/WTR7 and the patient's care was started at 11:25 PM.   Chief Complaint  Patient presents with  . Otalgia    Rt  . Dental Pain  . Headache   The history is provided by the patient. No language interpreter was used.   HPI Comments: Teresa Glenn is a 32 y.o. female who presents to the Emergency Department complaining of gradual onset, gradually worsening, constant dental pain that started earlier today. Pt also reports associated HA and otalgia. Pt states she has moments of intense pain that lasts for a few seconds. Pt states nothing necessarily makes the pain better, but breathing in through her nose causes great facial pain. She says she has taken tylenol with no relief. Pt denies any one in her household being sick.Pt denies nausea, photophobia, or vomiting. Pt denies drainage. Pt denies a hx of diabetes. Pt states she is otherwise healthy.  .  Pt is in the process of getting established with  A PCP. Past Medical History  Diagnosis Date  . No pertinent past medical history    Past Surgical History  Procedure Laterality Date  . Dilation and curettage of uterus     No family history on file. History  Substance Use Topics  . Smoking status: Never Smoker   . Smokeless tobacco: Not on file  . Alcohol Use: No   OB History   Grav Para Term Preterm Abortions TAB SAB Ect Mult Living   5 2 2  2  2   2      Review of Systems  Eyes: Negative for photophobia.  Gastrointestinal: Negative for nausea and vomiting.  All other systems reviewed and are negative.    Allergies  Review of patient's allergies indicates no known allergies.  Home Medications  No current outpatient prescriptions  on file. BP 126/91  Pulse 71  Temp(Src) 99 F (37.2 C) (Oral)  Resp 18  Ht 5\' 5"  (1.651 m)  Wt 120 lb (54.432 kg)  BMI 19.97 kg/m2  SpO2 100%  LMP 05/11/2013 Physical Exam  Nursing note and vitals reviewed. Constitutional: She is oriented to person, place, and time. She appears well-developed and well-nourished.  HENT:  Head: Normocephalic and atraumatic.  No sinus tenderness TMs clear Good dentition without any visualized caries  No gingival tenderness   Eyes: EOM are normal.  Neck: Normal range of motion.  Cardiovascular: Normal rate, regular rhythm and normal heart sounds.   Pulmonary/Chest: Effort normal.  Musculoskeletal: Normal range of motion.  Neurological: She is alert and oriented to person, place, and time.  Skin: Skin is warm and dry.  Psychiatric: She has a normal mood and affect. Her behavior is normal.    ED Course  Procedures (including critical care time)  DIAGNOSTIC STUDIES: Oxygen Saturation is 100% on RA, Normal by my interpretation.    COORDINATION OF CARE: 11:22 PM- Will give antibiotics and pain medication. Discussed treatment plan with pt at bedside and pt agreed to plan.     Labs Review Labs Reviewed - No data to display Imaging Review No results found.  EKG Interpretation   None       MDM  No diagnosis found. 1. Sinusitis  Sharp  headache over right face/forehead, low grade temperature, ear and dental pain. Negative neurologic exam for abnormalities. Likely sinusitis. Will treat and encourage follow up.  I personally performed the services described in this documentation, which was scribed in my presence. The recorded information has been reviewed and is accurate.     Arnoldo Hooker, PA-C 05/14/13 [phone]

## 2013-05-13 NOTE — ED Notes (Signed)
Pt states she has a couple of bad teeth, rt ear and head pain, ? Related to this

## 2013-05-14 NOTE — ED Notes (Signed)
Medical screening examination/treatment/procedure(s) were performed by non-physician practitioner and as supervising physician I was immediately available for consultation/collaboration.   Fedra Lanter M Jennah Satchell, MD 05/14/13 1539 

## 2013-05-15 ENCOUNTER — Encounter (HOSPITAL_COMMUNITY): Payer: Self-pay | Admitting: Emergency Medicine

## 2013-05-15 ENCOUNTER — Emergency Department (HOSPITAL_COMMUNITY)
Admission: EM | Admit: 2013-05-15 | Discharge: 2013-05-15 | Disposition: A | Discharge status: Home / Self Care | Payer: Medicaid Other | Attending: Emergency Medicine | Admitting: Emergency Medicine

## 2013-05-15 DIAGNOSIS — J329 Chronic sinusitis, unspecified: Secondary | ICD-10-CM

## 2013-05-15 NOTE — ED Notes (Signed)
Pt reports that she was seen here Sunday and dx with sinusitis.  Pt reports that she needs education on what medication to take in between her doses of Hydrocodon as she was taking generic Tylenol in between.  Pt reports she just needs pain medication, has been using her nasal spray and ABX.

## 2013-05-15 NOTE — ED Provider Notes (Signed)
CSN: [id number]     Arrival date & time 05/15/13  2147 History   First MD Initiated Contact with Patient 05/15/13 2220     Chief Complaint  Patient presents with  . Facial Pain  . Headache   (Consider location/radiation/quality/duration/timing/severity/associated sxs/prior Treatment) HPI Comments: Teresa Glenn is a 32 y.o. female who presents to the Emergency Department complaining of gradual onset, gradually worsening, constant face pain that started 2 days ago. Patient was seen 2 days ago and was treated for sinusitis.  She states that she is still having persistent pain.  Patient states that she has only been taking 1 vicodin per day, and wants to know if there is anything else she can take.  She denies fevers or chills. Pt denies any one in her household being sick.Pt denies nausea, photophobia, or vomiting. Pt denies drainage. Pt denies a hx of diabetes. Pt states she is otherwise healthy.   The history is provided by the patient. No language interpreter was used.    Past Medical History  Diagnosis Date  . No pertinent past medical history    Past Surgical History  Procedure Laterality Date  . Dilation and curettage of uterus     History reviewed. No pertinent family history. History  Substance Use Topics  . Smoking status: Never Smoker   . Smokeless tobacco: Not on file  . Alcohol Use: No   OB History   Grav Para Term Preterm Abortions TAB SAB Ect Mult Living   5 2 2  2  2   2      Review of Systems  All other systems reviewed and are negative.    Allergies  Review of patient's allergies indicates no known allergies.  Home Medications   Current Outpatient Rx  Name  Route  Sig  Dispense  Refill  . acetaminophen (TYLENOL) 500 MG tablet   Oral   Take 1,000 mg by mouth every 6 (six) hours as needed for pain.         Marland Kitchen amoxicillin (AMOXIL) 500 MG capsule   Oral   Take 2 capsules (1,000 mg total) by mouth 2 (two) times daily.   40 capsule   0   .  HYDROcodone-acetaminophen (NORCO/VICODIN) 5-325 MG per tablet   Oral   Take 1 tablet by mouth once.   10 tablet   0   . triamcinolone (NASACORT AQ) 55 MCG/ACT nasal inhaler   Nasal   Place 2 sprays into the nose daily.   1 Inhaler   12    BP 117/61  Pulse 66  Temp(Src) 98.8 F (37.1 C) (Oral)  Resp 16  SpO2 100%  LMP 05/11/2013 Physical Exam  Nursing note and vitals reviewed. Constitutional: She is oriented to person, place, and time. She appears well-developed and well-nourished.  HENT:  Head: Normocephalic and atraumatic.  Right Ear: External ear normal.  Left Ear: External ear normal.  Mouth/Throat: Oropharynx is clear and moist. No oropharyngeal exudate.  Swollen, erythematous turbinates, maxillary sinuses tender to palpation, congestion seen behind right TM  Eyes: Conjunctivae and EOM are normal. Pupils are equal, round, and reactive to light.  No pain with eye movement  Neck: Normal range of motion. Neck supple.  Cardiovascular: Normal rate, regular rhythm and normal heart sounds.   Pulmonary/Chest: Effort normal and breath sounds normal. No respiratory distress. She has no wheezes. She has no rales. She exhibits no tenderness.  Abdominal: Soft.  Musculoskeletal: Normal range of motion.  Neurological: She is alert and  oriented to person, place, and time.  Skin: Skin is warm and dry.  Psychiatric: She has a normal mood and affect. Her behavior is normal. Judgment and thought content normal.    ED Course  Procedures (including critical care time) Labs Review Labs Reviewed - No data to display Imaging Review No results found.  EKG Interpretation   None       MDM   1. Sinusitis     Patient with sinusitis.  Will continue treatment with amox and vicodin.  Patient also given instructions that she can take ibuprofen in between doses of vicodin.  Patient is stable.  She is not in any apparent distress.  Will discharge to home with return precautions.  She is  not in any apparent distress.  She is stable and ready for discharge.  Filed Vitals:   05/15/13 2201  BP: 117/61  Pulse: 66  Temp: 98.8 F (37.1 C)  Resp: [address], [place] 05/15/13 2255

## 2013-05-16 NOTE — ED Provider Notes (Signed)
Medical screening examination/treatment/procedure(s) were performed by non-physician practitioner and as supervising physician I was immediately available for consultation/collaboration.    Junius Argyle, MD 05/16/13 [phone]

## 2014-05-03 ENCOUNTER — Encounter (HOSPITAL_COMMUNITY): Payer: Self-pay | Admitting: General Practice

## 2014-05-03 ENCOUNTER — Inpatient Hospital Stay (HOSPITAL_COMMUNITY)
Admission: AD | Admit: 2014-05-03 | Discharge: 2014-05-03 | Disposition: A | Discharge status: Home / Self Care | Payer: Medicaid Other | Source: Ambulatory Visit | Attending: Family Medicine | Admitting: Family Medicine

## 2014-05-03 DIAGNOSIS — R109 Unspecified abdominal pain: Secondary | ICD-10-CM | POA: Insufficient documentation

## 2014-05-03 DIAGNOSIS — N898 Other specified noninflammatory disorders of vagina: Secondary | ICD-10-CM | POA: Insufficient documentation

## 2014-05-03 LAB — WET PREP, GENITAL
TRICH WET PREP: NONE SEEN
Yeast Wet Prep HPF POC: NONE SEEN

## 2014-05-03 LAB — URINALYSIS, ROUTINE W REFLEX MICROSCOPIC
Bilirubin Urine: NEGATIVE
GLUCOSE, UA: NEGATIVE mg/dL
Hgb urine dipstick: NEGATIVE
KETONES UR: NEGATIVE mg/dL
Nitrite: NEGATIVE
PH: 7 (ref 5.0–8.0)
Protein, ur: NEGATIVE mg/dL
SPECIFIC GRAVITY, URINE: 1.01 (ref 1.005–1.030)
Urobilinogen, UA: 0.2 mg/dL (ref 0.0–1.0)

## 2014-05-03 LAB — POCT PREGNANCY, URINE: Preg Test, Ur: NEGATIVE

## 2014-05-03 LAB — URINE MICROSCOPIC-ADD ON

## 2014-05-03 NOTE — Discharge Instructions (Signed)

## 2014-05-03 NOTE — MAU Note (Signed)
Patient state sshe has been having abdominal pain, nausea but no vomiting for 2 days. Denies vaginal bleeding but does have a white thick vaginal discharge.

## 2014-05-03 NOTE — MAU Provider Note (Signed)
History     CSN: [id number]  Arrival date and time: 05/03/14 1442   First Provider Initiated Contact with Patient 05/03/14 1724      Chief Complaint  Patient presents with  . Possible Pregnancy  . Nausea  . Abdominal Cramping  . Vaginal Discharge   HPI Teresa Glenn 33 y.o. [id number] nonpregnant female [facility].  She spoke briefly with a staff person there and was advised to come here because she was not sure if she was pregnant.  She is having cramping 6-7/10 consistent with a bad period and discharge that is unusual.  The discharge is thick and white.  She has no itching, smell.  She did have nausea but no vomiting.  She took Plan B at the beginning of this month.  Her chest pain is associated with coughing.  She has also had some shortness of breath.  She has had allergies but no recent sickness.  She has had this before and was thought to be associated with anxiety.  Her chest pain is a lot better now that she has been resting.  She is offered pain medication now but declines at this point.    OB History   Grav Para Term Preterm Abortions TAB SAB Ect Mult Living   5 2 2  2  2   2       Past Medical History  Diagnosis Date  . No pertinent past medical history   . Medical history non-contributory     Past Surgical History  Procedure Laterality Date  . Dilation and curettage of uterus      History reviewed. No pertinent family history.  History  Substance Use Topics  . Smoking status: Never Smoker   . Smokeless tobacco: Not on file  . Alcohol Use: No    Allergies: No Known Allergies  Prescriptions prior to admission  Medication Sig Dispense Refill  . Levonorgestrel (PLAN B PO) Take 1 tablet by mouth once.        Review of Systems  Constitutional: Negative for fever, chills and diaphoresis.  HENT: Positive for sore throat. Negative for congestion.   Eyes: Negative for blurred vision and double vision.  Respiratory: Positive for  cough and shortness of breath. Negative for wheezing.   Cardiovascular: Positive for chest pain and palpitations.  Gastrointestinal: Positive for nausea and abdominal pain. Negative for heartburn, vomiting, diarrhea and constipation.  Genitourinary: Negative for dysuria and frequency.  Musculoskeletal: Negative for back pain and neck pain.  Skin: Negative for itching and rash.  Neurological: Positive for weakness. Negative for dizziness, tingling and headaches.  Psychiatric/Behavioral: Negative for depression, suicidal ideas and substance abuse. The patient is not nervous/anxious.    Physical Exam   Blood pressure 109/74, pulse 68, temperature 98.9 F (37.2 C), temperature source Oral, resp. rate 16, height 5' 5.5" (1.664 m), weight 117 lb 12.8 oz (53.434 kg), last menstrual period 04/06/2014, SpO2 100.00%.  Physical Exam  Constitutional: She is oriented to person, place, and time. She appears well-developed and well-nourished. No distress.  HENT:  Head: Normocephalic and atraumatic.  Eyes: EOM are normal.  Neck: Normal range of motion.  Cardiovascular: Normal rate, regular rhythm and normal heart sounds.   Respiratory: Effort normal and breath sounds normal. No respiratory distress. She has no wheezes. She has no rales.  GI: Soft. She exhibits no distension. There is no tenderness. There is no rebound.  Genitourinary:  Small amt of white homogenous discharge.   No CMT,  No adnexal mass or tenderness  Musculoskeletal: Normal range of motion.  Neurological: She is alert and oriented to person, place, and time.  Skin: Skin is warm and dry.  Psychiatric: She has a normal mood and affect.   Results for orders placed during the hospital encounter of 05/03/14 (from the past 24 hour(s))  URINALYSIS, ROUTINE W REFLEX MICROSCOPIC     Status: Abnormal   Collection Time    05/03/14  3:15 PM      Result Value Ref Range   Color, Urine YELLOW  YELLOW   APPearance CLEAR  CLEAR   Specific  Gravity, Urine 1.010  1.005 - 1.030   pH 7.0  5.0 - 8.0   Glucose, UA NEGATIVE  NEGATIVE mg/dL   Hgb urine dipstick NEGATIVE  NEGATIVE   Bilirubin Urine NEGATIVE  NEGATIVE   Ketones, ur NEGATIVE  NEGATIVE mg/dL   Protein, ur NEGATIVE  NEGATIVE mg/dL   Urobilinogen, UA 0.2  0.0 - 1.0 mg/dL   Nitrite NEGATIVE  NEGATIVE   Leukocytes, UA SMALL (*) NEGATIVE  URINE MICROSCOPIC-ADD ON     Status: Abnormal   Collection Time    05/03/14  3:15 PM      Result Value Ref Range   Squamous Epithelial / LPF FEW (*) RARE   WBC, UA 3-6  <3 WBC/hpf  POCT PREGNANCY, URINE     Status: None   Collection Time    05/03/14  3:26 PM      Result Value Ref Range   Preg Test, Ur NEGATIVE  NEGATIVE  WET PREP, GENITAL     Status: Abnormal   Collection Time    05/03/14  5:55 PM      Result Value Ref Range   Yeast Wet Prep HPF POC NONE SEEN  NONE SEEN   Trich, Wet Prep NONE SEEN  NONE SEEN   Clue Cells Wet Prep HPF POC FEW (*) NONE SEEN   WBC, Wet Prep HPF POC MANY (*) NONE SEEN    MAU Course  Procedures none MDM Chest pain is reportedly resolved.  Oxygen saturation at 100%.  BP/pulse normal. Wet prep not suggestive of any particular diagnosis.    Assessment and Plan  A: Vaginal discharge and cramping  P: Discharge to home OTC Ibuprofen for discomfort GC/Chlam/HIV/RPR pending See Urgent Care/ED should chest pain return. See HD should discharge worsen and for management of menses.  Likely change in menses due to Plan B usage.  Return to MAU for GYN emergency.  Bertram Denvereague Clark, Suman Trivedi E 05/03/2014, 5:25 PM

## 2014-05-04 LAB — RPR

## 2014-05-04 LAB — GC/CHLAMYDIA PROBE AMP
CT PROBE, AMP APTIMA: NEGATIVE
GC Probe RNA: POSITIVE — AB

## 2014-05-04 LAB — HIV ANTIBODY (ROUTINE TESTING W REFLEX): HIV: NONREACTIVE

## 2014-05-04 NOTE — MAU Provider Note (Signed)
Attestation of Attending Supervision of Advanced Practitioner (PA/CNM/NP): Evaluation and management procedures were performed by the Advanced Practitioner under my supervision and collaboration.  I have reviewed the Advanced Practitioner's note and chart, and I agree with the management and plan.  Jacob Stinson, DO Attending Physician Faculty Practice, [facility]  

## 2014-05-06 ENCOUNTER — Ambulatory Visit (INDEPENDENT_AMBULATORY_CARE_PROVIDER_SITE_OTHER): Payer: Medicaid Other | Admitting: *Deleted

## 2014-05-06 VITALS — BP 114/68 | HR 72 | Temp 98.2°F

## 2014-05-06 DIAGNOSIS — A549 Gonococcal infection, unspecified: Secondary | ICD-10-CM

## 2014-05-06 MED ORDER — LIDOCAINE HCL 1 % IJ SOLN: 250.0000 mg | Freq: Every day | INTRAMUSCULAR | Status: AC

## 2014-05-06 MED ORDER — AZITHROMYCIN 250 MG PO TABS
1000.0000 mg | ORAL_TABLET | Freq: Once | ORAL | Status: AC
  Administered 2014-05-06: 1000 mg via ORAL

## 2014-05-06 NOTE — Progress Notes (Signed)
Pt informed of positive Gonorrhea test, informed we will treat with injection/po medication.  Pt verbalizes that partner has been treated. Treatment given.

## 2014-05-27 ENCOUNTER — Encounter (HOSPITAL_COMMUNITY): Payer: Self-pay | Admitting: General Practice

## 2017-03-15 ENCOUNTER — Encounter (HOSPITAL_COMMUNITY): Payer: Self-pay | Admitting: Emergency Medicine

## 2017-03-15 ENCOUNTER — Emergency Department (HOSPITAL_COMMUNITY)
Admission: EM | Admit: 2017-03-15 | Discharge: 2017-03-15 | Disposition: A | Discharge status: Home / Self Care | Payer: Medicaid Other | Attending: Emergency Medicine | Admitting: Emergency Medicine

## 2017-03-15 DIAGNOSIS — M542 Cervicalgia: Secondary | ICD-10-CM | POA: Insufficient documentation

## 2017-03-15 MED ORDER — AMOXICILLIN-POT CLAVULANATE 875-125 MG PO TABS: 1.0000 | ORAL_TABLET | Freq: Two times a day (BID) | ORAL | 0 refills | Status: DC

## 2017-03-15 NOTE — ED Provider Notes (Signed)
[facility]-EMERGENCY DEPT Provider Note   CSN: [id number] Arrival date & time: 03/15/17  0831     History   Chief Complaint Chief Complaint  Patient presents with  . Neck Pain    HPI Teresa Glenn is a 36 y.o. female who presents with left sided neck pain. She states that she first noticed the pain several days ago. It gradually worsened and has persisted for several days. It feels like a soreness. She has been taking OTC allergy medicine with no relief. She states that her sinuses has been "acting up" for several weeks. She reports congestion and runny nose. She denies fever, ear pain, sore throat. She denies trauma to the neck, pain with neck movement, headache, arm weakness or numbness/tingling.  HPI  Past Medical History:  Diagnosis Date  . Medical history non-contributory   . No pertinent past medical history     There are no active problems to display for this patient.   Past Surgical History:  Procedure Laterality Date  . DILATION AND CURETTAGE OF UTERUS      OB History    Gravida Para Term Preterm AB Living   5 2 2   2 2    SAB TAB Ectopic Multiple Live Births   2               Home Medications    Prior to Admission medications   Not on File    Family History History reviewed. No pertinent family history.  Social History Social History  Substance Use Topics  . Smoking status: Never Smoker  . Smokeless tobacco: Not on file  . Alcohol use No     Allergies   Patient has no known allergies.   Review of Systems Review of Systems  Constitutional: Negative for fever.  HENT: Positive for congestion, rhinorrhea and sinus pressure. Negative for ear pain, facial swelling, sore throat and trouble swallowing.   Musculoskeletal: Positive for neck pain.  Neurological: Negative for weakness, numbness and headaches.  Hematological: Negative for adenopathy.     Physical Exam Updated Vital Signs BP 117/81 (BP Location: Right Arm)   Pulse 72   Temp  98.2 F (36.8 C) (Oral)   Resp 17   Ht 5\' 5"  (1.651 m)   Wt 59.3 kg (130 lb 11.2 oz)   SpO2 100%   BMI 21.75 kg/m   Physical Exam  Constitutional: She is oriented to person, place, and time. She appears well-developed and well-nourished. No distress.  Well-appearing, NAD  HENT:  Head: Normocephalic and atraumatic.  Right Ear: Hearing, tympanic membrane, external ear and ear canal normal.  Left Ear: Hearing, tympanic membrane, external ear and ear canal normal.  Nose: Mucosal edema present. Right sinus exhibits maxillary sinus tenderness. Left sinus exhibits maxillary sinus tenderness.  Mouth/Throat: Uvula is midline, oropharynx is clear and moist and mucous membranes are normal.  Eyes: Pupils are equal, round, and reactive to light. Conjunctivae are normal. Right eye exhibits no discharge. Left eye exhibits no discharge. No scleral icterus.  Neck: Normal range of motion. Neck supple.  Significant tenderness over left posterior occiput. No obvious swelling  No midline tenderness. No paraspinal muscle tenderness  Cardiovascular: Normal rate.   Pulmonary/Chest: Effort normal. No respiratory distress.  Abdominal: She exhibits no distension.  Lymphadenopathy:    She has no cervical adenopathy.  Neurological: She is alert and oriented to person, place, and time.  Skin: Skin is warm and dry.  Psychiatric: She has a normal mood and affect. Her  behavior is normal.  Nursing note and vitals reviewed.    ED Treatments / Results  Labs (all labs ordered are listed, but only abnormal results are displayed) Labs Reviewed - No data to display  EKG  EKG Interpretation None       Radiology No results found.  Procedures Procedures (including critical care time)  Medications Ordered in ED Medications - No data to display   Initial Impression / Assessment and Plan / ED Course  I have reviewed the triage vital signs and the nursing notes.  Pertinent labs & imaging results that  were available during my care of the patient were reviewed by me and considered in my medical decision making (see chart for details).  36 year old female with left sided upper neck tenderness. Vitals are normal. Exam is remarkable for marked tenderness over left posterior neck with no obvious adenopathy or signs of infection. She has been having "sinus issues" for several weeks without relief from allergy medicine. Will try a course of antibiotics in addition to allergy medicine and Flonase. Return precautions given.  Final Clinical Impressions(s) / ED Diagnoses   Final diagnoses:  Neck pain    New Prescriptions New Prescriptions   No medications on file     Beryle Quant 03/15/17 1014    Geoffery Lyons, MD 03/15/17 [phone]

## 2017-03-15 NOTE — ED Notes (Signed)
Pt c/o tenderness to scalp at left edge of hairline. No redness or swelling noted. Pt denies any recent hair treatments or braiding.

## 2017-03-15 NOTE — Discharge Instructions (Signed)
Continue allergy medicine and try Flonase nasal spray for allergies and congestion Take Augmentin twice a day for a week with food Return for worsening symptoms

## 2017-03-15 NOTE — ED Triage Notes (Signed)
Pt here for left sided neck into back of head behind ear pain with palpation x 4 days

## 2017-06-06 ENCOUNTER — Encounter (HOSPITAL_COMMUNITY): Payer: Self-pay | Admitting: *Deleted

## 2017-06-06 ENCOUNTER — Inpatient Hospital Stay (HOSPITAL_COMMUNITY)
Admission: AD | Admit: 2017-06-06 | Discharge: 2017-06-06 | Disposition: A | Discharge status: Home / Self Care | Payer: BLUE CROSS/BLUE SHIELD | Source: Ambulatory Visit | Attending: Family Medicine | Admitting: Family Medicine

## 2017-06-06 DIAGNOSIS — N898 Other specified noninflammatory disorders of vagina: Secondary | ICD-10-CM

## 2017-06-06 DIAGNOSIS — O26891 Other specified pregnancy related conditions, first trimester: Secondary | ICD-10-CM

## 2017-06-06 DIAGNOSIS — O23591 Infection of other part of genital tract in pregnancy, first trimester: Secondary | ICD-10-CM | POA: Insufficient documentation

## 2017-06-06 DIAGNOSIS — B9689 Other specified bacterial agents as the cause of diseases classified elsewhere: Secondary | ICD-10-CM

## 2017-06-06 DIAGNOSIS — N760 Acute vaginitis: Secondary | ICD-10-CM

## 2017-06-06 DIAGNOSIS — Z3A01 Less than 8 weeks gestation of pregnancy: Secondary | ICD-10-CM | POA: Diagnosis not present

## 2017-06-06 LAB — URINALYSIS, ROUTINE W REFLEX MICROSCOPIC
Bilirubin Urine: NEGATIVE
GLUCOSE, UA: NEGATIVE mg/dL
Hgb urine dipstick: NEGATIVE
KETONES UR: NEGATIVE mg/dL
Nitrite: NEGATIVE
PROTEIN: 30 mg/dL — AB
Specific Gravity, Urine: 1.032 — ABNORMAL HIGH (ref 1.005–1.030)
pH: 5 (ref 5.0–8.0)

## 2017-06-06 LAB — WET PREP, GENITAL
Sperm: NONE SEEN
TRICH WET PREP: NONE SEEN
YEAST WET PREP: NONE SEEN

## 2017-06-06 LAB — POCT PREGNANCY, URINE: PREG TEST UR: POSITIVE — AB

## 2017-06-06 MED ORDER — METRONIDAZOLE 500 MG PO TABS: 500.0000 mg | ORAL_TABLET | Freq: Two times a day (BID) | ORAL | 0 refills | Status: DC

## 2017-06-06 NOTE — MAU Provider Note (Signed)
History     CSN: [id number]  Arrival date and time: 06/06/17 [id number]   First Provider Initiated Contact with Patient 06/06/17 1852     Chief Complaint  Patient presents with  . Possible Pregnancy  . Vaginal Discharge   HPI Teresa Glenn is a 36 y.o. [id number] at 7560w0d who presents with vaginal discharge. She states she has had a brown discharge for 2 days and some vaginal irritation. She denies any pain or vaginal bleeding. She had a positive HPT and her LMP was 05/02/2017.  OB History    Gravida Para Term Preterm AB Living   6 2 2   2 2    SAB TAB Ectopic Multiple Live Births   2              Past Medical History:  Diagnosis Date  . Medical history non-contributory   . No pertinent past medical history     Past Surgical History:  Procedure Laterality Date  . DILATION AND CURETTAGE OF UTERUS      History reviewed. No pertinent family history.  Social History   Tobacco Use  . Smoking status: Never Smoker  . Smokeless tobacco: Never Used  Substance Use Topics  . Alcohol use: No    Comment: occasional  . Drug use: No    Allergies: No Known Allergies  No medications prior to admission.    Review of Systems  Constitutional: Negative.  Negative for fatigue and fever.  HENT: Negative.   Respiratory: Negative.  Negative for shortness of breath.   Cardiovascular: Negative.  Negative for chest pain.  Gastrointestinal: Negative.  Negative for abdominal pain, constipation, diarrhea, nausea and vomiting.  Genitourinary: Positive for vaginal discharge. Negative for dysuria and vaginal bleeding.  Neurological: Negative.  Negative for dizziness and headaches.   Physical Exam   Blood pressure 106/73, pulse 71, temperature 98.2 F (36.8 C), temperature source Oral, resp. rate 16, weight 130 lb 1.9 oz (59 kg), last menstrual period 05/02/2017, SpO2 100 %.  Physical Exam  Nursing note and vitals reviewed. Constitutional: She is oriented to person, place, and time.  She appears well-developed and well-nourished. No distress.  HENT:  Head: Normocephalic.  Eyes: Pupils are equal, round, and reactive to light.  Cardiovascular: Normal rate, regular rhythm and normal heart sounds.  Respiratory: Effort normal and breath sounds normal. No respiratory distress.  GI: Soft. Bowel sounds are normal. She exhibits no distension. There is no tenderness.  Genitourinary: Vaginal discharge found.  Neurological: She is alert and oriented to person, place, and time.  Skin: Skin is warm and dry.  Psychiatric: She has a normal mood and affect. Her behavior is normal. Judgment and thought content normal.   Pelvic exam: Cervix pink, visually closed, without lesion, scant white creamy discharge, vaginal walls and external genitalia normal Bimanual exam: Cervix 0/long/high, firm, anterior, neg CMT, uterus nontender, adnexa without tenderness, enlargement, or mass  MAU Course  Procedures Results for orders placed or performed during the hospital encounter of 06/06/17 (from the past 24 hour(s))  Urinalysis, Routine w reflex microscopic     Status: Abnormal   Collection Time: 06/06/17  6:35 PM  Result Value Ref Range   Color, Urine YELLOW YELLOW   APPearance CLEAR CLEAR   Specific Gravity, Urine 1.032 (H) 1.005 - 1.030   pH 5.0 5.0 - 8.0   Glucose, UA NEGATIVE NEGATIVE mg/dL   Hgb urine dipstick NEGATIVE NEGATIVE   Bilirubin Urine NEGATIVE NEGATIVE   Ketones, ur NEGATIVE NEGATIVE mg/dL  Protein, ur 30 (A) NEGATIVE mg/dL   Nitrite NEGATIVE NEGATIVE   Leukocytes, UA SMALL (A) NEGATIVE   RBC / HPF 0-5 0 - 5 RBC/hpf   WBC, UA 0-5 0 - 5 WBC/hpf   Bacteria, UA RARE (A) NONE SEEN   Squamous Epithelial / LPF 0-5 (A) NONE SEEN   Mucus PRESENT   Pregnancy, urine POC     Status: Abnormal   Collection Time: 06/06/17  6:47 PM  Result Value Ref Range   Preg Test, Ur POSITIVE (A) NEGATIVE  Wet prep, genital     Status: Abnormal   Collection Time: 06/06/17  7:00 PM  Result  Value Ref Range   Yeast Wet Prep HPF POC NONE SEEN NONE SEEN   Trich, Wet Prep NONE SEEN NONE SEEN   Clue Cells Wet Prep HPF POC PRESENT (A) NONE SEEN   WBC, Wet Prep HPF POC MANY (A) NONE SEEN   Sperm NONE SEEN    MDM UA, UPT Wet prep and gc/chlamydia  Assessment and Plan   1. Bacterial vaginosis   2. Vaginal discharge during pregnancy in first trimester   3. [redacted] weeks gestation of pregnancy    -Discharge home in stable condition -Rx for metronidazole sent to patient's pharmacy -First trimester precautions discussed -Patient advised to follow-up with OB of choice to start prenatal care, list given Patient may return to MAU as needed or if her condition were to change or worsen   Rolm BookbinderCaroline M Terry [place]otin CNM 06/06/2017, 8:11 PM

## 2017-06-06 NOTE — Discharge Instructions (Signed)
Bacterial Vaginosis Bacterial vaginosis is an infection of the vagina. It happens when too many germs (bacteria) grow in the vagina. This infection puts you at risk for infections from sex (STIs). Treating this infection can lower your risk for some STIs. You should also treat this if you are pregnant. It can cause your baby to be born early. Follow these instructions at home: Medicines  Take over-the-counter and prescription medicines only as told by your doctor.  Take or use your antibiotic medicine as told by your doctor. Do not stop taking or using it even if you start to feel better. General instructions  If you your sexual partner is a woman, tell her that you have this infection. She needs to get treatment if she has symptoms. If you have a female partner, he does not need to be treated.  During treatment: ? Avoid sex. ? Do not douche. ? Avoid alcohol as told. ? Avoid breastfeeding as told.  Drink enough fluid to keep your pee (urine) clear or pale yellow.  Keep your vagina and butt (rectum) clean. ? Wash the area with warm water every day. ? Wipe from front to back after you use the toilet.  Keep all follow-up visits as told by your doctor. This is important. Preventing this condition  Do not douche.  Use only warm water to wash around your vagina.  Use protection when you have sex. This includes: ? Latex condoms. ? Dental dams.  Limit how many people you have sex with. It is best to only have sex with the same person (be monogamous).  Get tested for STIs. Have your partner get tested.  Wear underwear that is cotton or lined with cotton.  Avoid tight pants and pantyhose. This is most important in summer.  Do not use any products that have nicotine or tobacco in them. These include cigarettes and e-cigarettes. If you need help quitting, ask your doctor.  Do not use illegal drugs.  Limit how much alcohol you drink. Contact a doctor if:  Your symptoms do not get  better, even after you are treated.  You have more discharge or pain when you pee (urinate).  You have a fever.  You have pain in your belly (abdomen).  You have pain with sex.  Your bleed from your vagina between periods. Summary  This infection happens when too many germs (bacteria) grow in the vagina.  Treating this condition can lower your risk for some infections from sex (STIs).  You should also treat this if you are pregnant. It can cause early (premature) birth.  Do not stop taking or using your antibiotic medicine even if you start to feel better. This information is not intended to replace advice given to you by your health care provider. Make sure you discuss any questions you have with your health care provider. Document Released: 04/20/2008 Document Revised: 03/27/2016 Document Reviewed: 03/27/2016 Elsevier Interactive Patient Education  2017 [employer]. First Trimester of Pregnancy The first trimester of pregnancy is from week 1 until the end of week 13 (months 1 through 3). A week after a sperm fertilizes an egg, the egg will implant on the wall of the uterus. This embryo will begin to develop into a baby. Genes from you and your partner will form the baby. The female genes will determine whether the baby will be a boy or a girl. At 6-8 weeks, the eyes and face will be formed, and the heartbeat can be seen on ultrasound. At the  end of 12 weeks, all the baby's organs will be formed. Now that you are pregnant, you will want to do everything you can to have a healthy baby. Two of the most important things are to get good prenatal care and to follow your health care provider's instructions. Prenatal care is all the medical care you receive before the baby's birth. This care will help prevent, find, and treat any problems during the pregnancy and childbirth. Body changes during your first trimester Your body goes through many changes during pregnancy. The changes vary from woman  to woman.  You may gain or lose a couple of pounds at first.  You may feel sick to your stomach (nauseous) and you may throw up (vomit). If the vomiting is uncontrollable, call your health care provider.  You may tire easily.  You may develop headaches that can be relieved by medicines. All medicines should be approved by your health care provider.  You may urinate more often. Painful urination may mean you have a bladder infection.  You may develop heartburn as a result of your pregnancy.  You may develop constipation because certain hormones are causing the muscles that push stool through your intestines to slow down.  You may develop hemorrhoids or swollen veins (varicose veins).  Your breasts may begin to grow larger and become tender. Your nipples may stick out more, and the tissue that surrounds them (areola) may become darker.  Your gums may bleed and may be sensitive to brushing and flossing.  Dark spots or blotches (chloasma, mask of pregnancy) may develop on your face. This will likely fade after the baby is born.  Your menstrual periods will stop.  You may have a loss of appetite.  You may develop cravings for certain kinds of food.  You may have changes in your emotions from day to day, such as being excited to be pregnant or being concerned that something may go wrong with the pregnancy and baby.  You may have more vivid and strange dreams.  You may have changes in your hair. These can include thickening of your hair, rapid growth, and changes in texture. Some women also have hair loss during or after pregnancy, or hair that feels dry or thin. Your hair will most likely return to normal after your baby is born.  What to expect at prenatal visits During a routine prenatal visit:  You will be weighed to make sure you and the baby are growing normally.  Your blood pressure will be taken.  Your abdomen will be measured to track your baby's growth.  The fetal  heartbeat will be listened to between weeks 10 and 14 of your pregnancy.  Test results from any previous visits will be discussed.  Your health care provider may ask you:  How you are feeling.  If you are feeling the baby move.  If you have had any abnormal symptoms, such as leaking fluid, bleeding, severe headaches, or abdominal cramping.  If you are using any tobacco products, including cigarettes, chewing tobacco, and electronic cigarettes.  If you have any questions.  Other tests that may be performed during your first trimester include:  Blood tests to find your blood type and to check for the presence of any previous infections. The tests will also be used to check for low iron levels (anemia) and protein on red blood cells (Rh antibodies). Depending on your risk factors, or if you previously had diabetes during pregnancy, you may have tests to check  for high blood sugar that affects pregnant women (gestational diabetes).  Urine tests to check for infections, diabetes, or protein in the urine.  An ultrasound to confirm the proper growth and development of the baby.  Fetal screens for spinal cord problems (spina bifida) and Down syndrome.  HIV (human immunodeficiency virus) testing. Routine prenatal testing includes screening for HIV, unless you choose not to have this test.  You may need other tests to make sure you and the baby are doing well.  Follow these instructions at home: Medicines  Follow your health care provider's instructions regarding medicine use. Specific medicines may be either safe or unsafe to take during pregnancy.  Take a prenatal vitamin that contains at least 600 micrograms (mcg) of folic acid.  If you develop constipation, try taking a stool softener if your health care provider approves. Eating and drinking  Eat a balanced diet that includes fresh fruits and vegetables, whole grains, good sources of protein such as meat, eggs, or tofu, and low-fat  dairy. Your health care provider will help you determine the amount of weight gain that is right for you.  Avoid raw meat and uncooked cheese. These carry germs that can cause birth defects in the baby.  Eating four or five small meals rather than three large meals a day may help relieve nausea and vomiting. If you start to feel nauseous, eating a few soda crackers can be helpful. Drinking liquids between meals, instead of during meals, also seems to help ease nausea and vomiting.  Limit foods that are high in fat and processed sugars, such as fried and sweet foods.  To prevent constipation: ? Eat foods that are high in fiber, such as fresh fruits and vegetables, whole grains, and beans. ? Drink enough fluid to keep your urine clear or pale yellow. Activity  Exercise only as directed by your health care provider. Most women can continue their usual exercise routine during pregnancy. Try to exercise for 30 minutes at least 5 days a week. Exercising will help you: ? Control your weight. ? Stay in shape. ? Be prepared for labor and delivery.  Experiencing pain or cramping in the lower abdomen or lower back is a good sign that you should stop exercising. Check with your health care provider before continuing with normal exercises.  Try to avoid standing for long periods of time. Move your legs often if you must stand in one place for a long time.  Avoid heavy lifting.  Wear low-heeled shoes and practice good posture.  You may continue to have sex unless your health care provider tells you not to. Relieving pain and discomfort  Wear a good support bra to relieve breast tenderness.  Take warm sitz baths to soothe any pain or discomfort caused by hemorrhoids. Use hemorrhoid cream if your health care provider approves.  Rest with your legs elevated if you have leg cramps or low back pain.  If you develop varicose veins in your legs, wear support hose. Elevate your feet for 15 minutes, 3-4  times a day. Limit salt in your diet. Prenatal care  Schedule your prenatal visits by the twelfth week of pregnancy. They are usually scheduled monthly at first, then more often in the last 2 months before delivery.  Write down your questions. Take them to your prenatal visits.  Keep all your prenatal visits as told by your health care provider. This is important. Safety  Wear your seat belt at all times when driving.  Make a  list of emergency phone numbers, including numbers for family, friends, the hospital, and police and fire departments. General instructions  Ask your health care provider for a referral to a local prenatal education class. Begin classes no later than the beginning of month 6 of your pregnancy.  Ask for help if you have counseling or nutritional needs during pregnancy. Your health care provider can offer advice or refer you to specialists for help with various needs.  Do not use hot tubs, steam rooms, or saunas.  Do not douche or use tampons or scented sanitary pads.  Do not cross your legs for long periods of time.  Avoid cat litter boxes and soil used by cats. These carry germs that can cause birth defects in the baby and possibly loss of the fetus by miscarriage or stillbirth.  Avoid all smoking, herbs, alcohol, and medicines not prescribed by your health care provider. Chemicals in these products affect the formation and growth of the baby.  Do not use any products that contain nicotine or tobacco, such as cigarettes and e-cigarettes. If you need help quitting, ask your health care provider. You may receive counseling support and other resources to help you quit.  Schedule a dentist appointment. At home, brush your teeth with a soft toothbrush and be gentle when you floss. Contact a health care provider if:  You have dizziness.  You have mild pelvic cramps, pelvic pressure, or nagging pain in the abdominal area.  You have persistent nausea, vomiting, or  diarrhea.  You have a bad smelling vaginal discharge.  You have pain when you urinate.  You notice increased swelling in your face, hands, legs, or ankles.  You are exposed to fifth disease or chickenpox.  You are exposed to [place] measles (rubella) and have never had it. Get help right away if:  You have a fever.  You are leaking fluid from your vagina.  You have spotting or bleeding from your vagina.  You have severe abdominal cramping or pain.  You have rapid weight gain or loss.  You vomit blood or material that looks like coffee grounds.  You develop a severe headache.  You have shortness of breath.  You have any kind of trauma, such as from a fall or a car accident. Summary  The first trimester of pregnancy is from week 1 until the end of week 13 (months 1 through 3).  Your body goes through many changes during pregnancy. The changes vary from woman to woman.  You will have routine prenatal visits. During those visits, your health care provider will examine you, discuss any test results you may have, and talk with you about how you are feeling. This information is not intended to replace advice given to you by your health care provider. Make sure you discuss any questions you have with your health care provider. Document Released: 07/06/2001 Document Revised: 06/23/2016 Document Reviewed: 06/23/2016 Elsevier Interactive Patient Education  2017 [employer].  [place] [facility] Providers    [facility]       Phone: [phone]  [facility] [place] Phone: [phone]  [facility] [place]  Phone: [phone]  [facility]  Phone: [phone]  [facility] [place]  Phone: [phone]  [facility]       Phone: [phone]  [facility]    Phone:  [phone]   [facility]  Phone: [phone]  [facility]    Phone: [phone]  [facility]    Phone: [phone]  [facility]    Phone: [phone]  [facility]       Phone: [phone]   [facility]  Phone: [phone]  [facility]    Phone: [phone]  [facility]   Phone: [phone]  [facility]      Phone: [phone]  [facility]    Phone: [phone]  Safe Medications in Pregnancy   Acne: Benzoyl Peroxide Salicylic Acid  Backache/Headache: Tylenol: 2 regular strength every 4 hours OR              2 Extra strength every 6 hours  Colds/Coughs/Allergies: Benadryl (alcohol free) 25 mg every 6 hours as needed Breath right strips Claritin Cepacol throat lozenges Chloraseptic throat spray Cold-Eeze- up to three times per day Cough drops, alcohol free Flonase (by prescription only) Guaifenesin Mucinex Robitussin DM (plain only, alcohol free) Saline nasal spray/drops Sudafed (pseudoephedrine) & Actifed ** use only after [redacted] weeks gestation and if you do not have high blood pressure Tylenol Vicks Vaporub Zinc lozenges Zyrtec   Constipation: Colace Ducolax suppositories Fleet enema Glycerin suppositories Metamucil Milk of magnesia Miralax Senokot Smooth move tea  Diarrhea: Kaopectate Imodium A-D  *NO pepto Bismol  Hemorrhoids: Anusol Anusol HC Preparation H Tucks  Indigestion: Tums Maalox Mylanta Zantac  Pepcid  Insomnia: Benadryl (alcohol free) 25mg  every 6 hours as needed Tylenol PM Unisom, no Gelcaps  Leg Cramps: Tums MagGel  Nausea/Vomiting:  Bonine Dramamine Emetrol Ginger extract Sea bands Meclizine  Nausea medication to take during pregnancy:  Unisom (doxylamine succinate 25 mg  tablets) Take one tablet daily at bedtime. If symptoms are not adequately controlled, the dose can be increased to a maximum recommended dose of two tablets daily (1/2 tablet in the morning, 1/2 tablet mid-afternoon and one at bedtime). Vitamin B6 100mg  tablets. Take one tablet twice a day (up to 200 mg per day).  Skin Rashes: Aveeno products Benadryl cream or 25mg  every 6 hours as needed Calamine Lotion 1% cortisone cream  Yeast infection: Gyne-lotrimin 7 Monistat 7   **If taking multiple medications, please check labels to avoid duplicating the same active ingredients **take medication as directed on the label ** Do not exceed 4000 mg of tylenol in 24 hours **Do not take medications that contain aspirin or ibuprofen

## 2017-06-06 NOTE — MAU Note (Signed)
+  HPT LMP 05/02/2017  +vaginal discharge "dark color" Vaginal itching  Denies pain.

## 2017-06-07 LAB — GC/CHLAMYDIA PROBE AMP (~~LOC~~) NOT AT ARMC
Chlamydia: NEGATIVE
NEISSERIA GONORRHEA: NEGATIVE

## 2017-06-13 ENCOUNTER — Other Ambulatory Visit: Payer: Self-pay

## 2017-06-13 ENCOUNTER — Inpatient Hospital Stay (HOSPITAL_COMMUNITY)
Admission: AD | Admit: 2017-06-13 | Discharge: 2017-06-13 | Disposition: A | Discharge status: Home / Self Care | Payer: BLUE CROSS/BLUE SHIELD | Source: Ambulatory Visit | Attending: Obstetrics and Gynecology | Admitting: Obstetrics and Gynecology

## 2017-06-13 ENCOUNTER — Encounter (HOSPITAL_COMMUNITY): Payer: Self-pay

## 2017-06-13 ENCOUNTER — Inpatient Hospital Stay (HOSPITAL_COMMUNITY): Payer: BLUE CROSS/BLUE SHIELD

## 2017-06-13 DIAGNOSIS — R110 Nausea: Secondary | ICD-10-CM | POA: Diagnosis not present

## 2017-06-13 DIAGNOSIS — Z79899 Other long term (current) drug therapy: Secondary | ICD-10-CM | POA: Diagnosis not present

## 2017-06-13 DIAGNOSIS — O3680X0 Pregnancy with inconclusive fetal viability, not applicable or unspecified: Secondary | ICD-10-CM | POA: Diagnosis not present

## 2017-06-13 DIAGNOSIS — Z3A01 Less than 8 weeks gestation of pregnancy: Secondary | ICD-10-CM | POA: Insufficient documentation

## 2017-06-13 DIAGNOSIS — O99111 Other diseases of the blood and blood-forming organs and certain disorders involving the immune mechanism complicating pregnancy, first trimester: Secondary | ICD-10-CM | POA: Diagnosis not present

## 2017-06-13 DIAGNOSIS — O219 Vomiting of pregnancy, unspecified: Secondary | ICD-10-CM

## 2017-06-13 DIAGNOSIS — O26891 Other specified pregnancy related conditions, first trimester: Secondary | ICD-10-CM | POA: Insufficient documentation

## 2017-06-13 DIAGNOSIS — O4690 Antepartum hemorrhage, unspecified, unspecified trimester: Secondary | ICD-10-CM

## 2017-06-13 DIAGNOSIS — Z6790 Unspecified blood type, Rh positive: Secondary | ICD-10-CM

## 2017-06-13 DIAGNOSIS — O4691 Antepartum hemorrhage, unspecified, first trimester: Secondary | ICD-10-CM

## 2017-06-13 LAB — URINALYSIS, ROUTINE W REFLEX MICROSCOPIC
BACTERIA UA: NONE SEEN
Bilirubin Urine: NEGATIVE
GLUCOSE, UA: NEGATIVE mg/dL
Hgb urine dipstick: NEGATIVE
KETONES UR: NEGATIVE mg/dL
Nitrite: NEGATIVE
PROTEIN: NEGATIVE mg/dL
Specific Gravity, Urine: 1.002 — ABNORMAL LOW (ref 1.005–1.030)
pH: 6 (ref 5.0–8.0)

## 2017-06-13 LAB — HCG, QUANTITATIVE, PREGNANCY: HCG, BETA CHAIN, QUANT, S: 18345 m[IU]/mL — AB (ref <5)

## 2017-06-13 LAB — CBC
HCT: 32.4 % — ABNORMAL LOW (ref 36.0–46.0)
HEMOGLOBIN: 9.3 g/dL — AB (ref 12.0–15.0)
MCH: 21.3 pg — AB (ref 26.0–34.0)
MCHC: 28.7 g/dL — ABNORMAL LOW (ref 30.0–36.0)
MCV: 74.3 fL — AB (ref 78.0–100.0)
Platelets: 438 10*3/uL — ABNORMAL HIGH (ref 150–400)
RBC: 4.36 MIL/uL (ref 3.87–5.11)
RDW: 18.3 % — ABNORMAL HIGH (ref 11.5–15.5)
WBC: 6.7 10*3/uL (ref 4.0–10.5)

## 2017-06-13 NOTE — Progress Notes (Signed)
D/c instructions reviewed, given/signed for.  Discussed OTC meds for nausea; handout given.  Pt denies ques/concerns. D/c in stable condition.

## 2017-06-13 NOTE — MAU Note (Signed)
Pt presents with c/o spotting with wiping that began yesterday.  Denies abdominal cramping.

## 2017-06-13 NOTE — Discharge Instructions (Signed)
Vaginal Bleeding During Pregnancy, First Trimester °A small amount of bleeding (spotting) from the vagina is common in early pregnancy. Sometimes the bleeding is normal and is not a problem, and sometimes it is a sign of something serious. Be sure to tell your doctor about any bleeding from your vagina right away. °Follow these instructions at home: °· Watch your condition for any changes. °· Follow your doctor's instructions about how active you can be. °· If you are on bed rest: °? You may need to stay in bed and only get up to use the bathroom. °? You may be allowed to do some activities. °? If you need help, make plans for someone to help you. °· Write down: °? The number of pads you use each day. °? How often you change pads. °? How soaked (saturated) your pads are. °· Do not use tampons. °· Do not douche. °· Do not have sex or orgasms until your doctor says it is okay. °· If you pass any tissue from your vagina, save the tissue so you can show it to your doctor. °· Only take medicines as told by your doctor. °· Do not take aspirin because it can make you bleed. °· Keep all follow-up visits as told by your doctor. °Contact a doctor if: °· You bleed from your vagina. °· You have cramps. °· You have labor pains. °· You have a fever that does not go away after you take medicine. °Get help right away if: °· You have very bad cramps in your back or belly (abdomen). °· You pass large clots or tissue from your vagina. °· You bleed more. °· You feel light-headed or weak. °· You pass out (faint). °· You have chills. °· You are leaking fluid or have a gush of fluid from your vagina. °· You pass out while pooping (having a bowel movement). °This information is not intended to replace advice given to you by your health care provider. Make sure you discuss any questions you have with your health care provider. °Document Released: 11/26/2013 Document Revised: 12/18/2015 Document Reviewed: 03/19/2013 °Elsevier Interactive  Patient Education © 2018 Elsevier [employer] ° °

## 2017-06-13 NOTE — MAU Provider Note (Signed)
History     CSN: [id number]  Arrival date and time: 06/13/17 1505   First Provider Initiated Contact with Patient 06/13/17 1551      Chief Complaint  Patient presents with  . Spotting   [id number] @[redacted]w[redacted]d  here with VB. She saw red blood on the toilet paper last night when she wiped. Discharge was pink tinged today. Had IC yesterday. No pain. Recently treated for BV, has 1 pill left. Also c/o nausea x2 days. No vomiting. Tolerating food and fluids but poor appetite.   OB History    Gravida Para Term Preterm AB Living   6 2 2  0 3 2   SAB TAB Ectopic Multiple Live Births   2 1 0 0 2      Past Medical History:  Diagnosis Date  . Medical history non-contributory   . No pertinent past medical history     Past Surgical History:  Procedure Laterality Date  . DILATION AND CURETTAGE OF UTERUS      History reviewed. No pertinent family history.  Social History   Tobacco Use  . Smoking status: Never Smoker  . Smokeless tobacco: Never Used  Substance Use Topics  . Alcohol use: No    Comment: occasional  . Drug use: No    Allergies: No Known Allergies  Medications Prior to Admission  Medication Sig Dispense Refill Last Dose  . metroNIDAZOLE (FLAGYL) 500 MG tablet Take 1 tablet (500 mg total) 2 (two) times daily by mouth. 14 tablet 0     Review of Systems  Gastrointestinal: Positive for nausea. Negative for abdominal pain and vomiting.  Genitourinary: Positive for vaginal bleeding.   Physical Exam   Blood pressure 111/63, pulse 82, temperature 98.4 F (36.9 C), temperature source Oral, resp. rate 18, height 5' 5.5" (1.664 m), weight 131 lb 4 oz (59.5 kg), last menstrual period 05/02/2017, SpO2 100 %.  Physical Exam  Constitutional: She is oriented to person, place, and time. She appears well-developed and well-nourished. No distress.  HENT:  Head: Normocephalic.  Neck: Normal range of motion.  Cardiovascular: Normal rate.  Respiratory: Effort normal. No respiratory  distress.  Genitourinary:  Genitourinary Comments: External: no lesions or erythema Vagina: rugated, pink, moist, moderate brown discharge, os closed Uterus: + enlarged, anteverted, non tender, no CMT Adnexae: no masses, no tenderness left, no tenderness right   Musculoskeletal: Normal range of motion.  Neurological: She is alert and oriented to person, place, and time.  Skin: Skin is warm and dry.  Psychiatric: She has a normal mood and affect.   Results for orders placed or performed during the hospital encounter of 06/13/17 (from the past 24 hour(s))  Urinalysis, Routine w reflex microscopic     Status: Abnormal   Collection Time: 06/13/17  3:30 PM  Result Value Ref Range   Color, Urine STRAW (A) YELLOW   APPearance CLEAR CLEAR   Specific Gravity, Urine 1.002 (L) 1.005 - 1.030   pH 6.0 5.0 - 8.0   Glucose, UA NEGATIVE NEGATIVE mg/dL   Hgb urine dipstick NEGATIVE NEGATIVE   Bilirubin Urine NEGATIVE NEGATIVE   Ketones, ur NEGATIVE NEGATIVE mg/dL   Protein, ur NEGATIVE NEGATIVE mg/dL   Nitrite NEGATIVE NEGATIVE   Leukocytes, UA MODERATE (A) NEGATIVE   RBC / HPF 0-5 0 - 5 RBC/hpf   WBC, UA 0-5 0 - 5 WBC/hpf   Bacteria, UA NONE SEEN NONE SEEN   Squamous Epithelial / LPF 0-5 (A) NONE SEEN  CBC     Status:  Abnormal   Collection Time: 06/13/17  3:35 PM  Result Value Ref Range   WBC 6.7 4.0 - 10.5 K/uL   RBC 4.36 3.87 - 5.11 MIL/uL   Hemoglobin 9.3 (L) 12.0 - 15.0 g/dL   HCT 16.1 (L) 09.6 - 04.5 %   MCV 74.3 (L) 78.0 - 100.0 fL   MCH 21.3 (L) 26.0 - 34.0 pg   MCHC 28.7 (L) 30.0 - 36.0 g/dL   RDW 40.9 (H) 81.1 - 91.4 %   Platelets 438 (H) 150 - 400 K/uL  hCG, quantitative, pregnancy     Status: Abnormal   Collection Time: 06/13/17  3:35 PM  Result Value Ref Range   hCG, Beta Chain, Quant, S 18,345 (H) <5 mIU/mL   US Ob Comp Less 14 Wks  Result Date: 06/13/2017 CLINICAL DATA:  Pregnant, vaginal bleeding EXAM: OBSTETRIC <14 WK [place] AND TRANSVAGINAL OB US TECHNIQUE: Both  transabdominal and transvaginal ultrasound examinations were performed for complete evaluation of the gestation as well as the maternal uterus, adnexal regions, and pelvic cul-de-sac. Transvaginal technique was performed to assess early pregnancy. COMPARISON:  None. FINDINGS: Intrauterine gestational sac: Single Yolk sac:  Visualized. Embryo:  Visualized. Cardiac Activity: Visualized. Heart Rate: 77  bpm CRL:  3.0  mm   5 w   6 d                  [place] EDC: 02/08/2018 Subchorionic hemorrhage:  None visualized. Maternal uterus/adnexae: Left ovary is within normal limits. Right ovary is suboptimally visualized. No free fluid. IMPRESSION: Single live intrauterine gestation, measuring 5 weeks 6 days by crown-rump length. Fetal bradycardia, possibly due to technical fractures related to early gestational age/small size. Consider follow-up pelvic ultrasound in 14 days as clinically warranted. Electronically Signed   By: Charline Bills M.D.   On: 06/13/2017 16:52   US Ob Transvaginal  Result Date: 06/13/2017 CLINICAL DATA:  Pregnant, vaginal bleeding EXAM: OBSTETRIC <14 WK [place] AND TRANSVAGINAL OB US TECHNIQUE: Both transabdominal and transvaginal ultrasound examinations were performed for complete evaluation of the gestation as well as the maternal uterus, adnexal regions, and pelvic cul-de-sac. Transvaginal technique was performed to assess early pregnancy. COMPARISON:  None. FINDINGS: Intrauterine gestational sac: Single Yolk sac:  Visualized. Embryo:  Visualized. Cardiac Activity: Visualized. Heart Rate: 77  bpm CRL:  3.0  mm   5 w   6 d                  [place] EDC: 02/08/2018 Subchorionic hemorrhage:  None visualized. Maternal uterus/adnexae: Left ovary is within normal limits. Right ovary is suboptimally visualized. No free fluid. IMPRESSION: Single live intrauterine gestation, measuring 5 weeks 6 days by crown-rump length. Fetal bradycardia, possibly due to technical fractures related to early gestational age/small  size. Consider follow-up pelvic ultrasound in 14 days as clinically warranted. Electronically Signed   By: Charline Bills M.D.   On: 06/13/2017 16:52   MAU Course  Procedures  MDM Labs and [place] ordered and reviewed. GC/CT negative 1 week ago, not repeated today. Normal IUP seen on [place] consistent with LMP however bradycardia noted, will order f/u US for viability. Discussed findings with pt. Stable for discharge home.  Assessment and Plan   1. [redacted] weeks gestation of pregnancy   2. Vaginal bleeding during pregnancy   3. Blood type, Rh positive   4. Pregnancy with inconclusive fetal viability, single or unspecified fetus    Discharge home Follow up in 2 weeks for US-radiology to  call and schedule SAB/bleeding precautions B6/Unisom OTC  Allergies as of 06/13/2017   No Known Allergies     Medication List    TAKE these medications   metroNIDAZOLE 500 MG tablet Commonly known as:  FLAGYL Take 1 tablet (500 mg total) 2 (two) times daily by mouth.       Donette LarryMelanie Madesyn Ast, CNM 06/13/2017, 4:03 PM

## 2017-07-15 ENCOUNTER — Other Ambulatory Visit: Payer: Self-pay

## 2017-07-15 ENCOUNTER — Inpatient Hospital Stay (HOSPITAL_COMMUNITY)
Admission: AD | Admit: 2017-07-15 | Discharge: 2017-07-15 | Disposition: A | Discharge status: Home / Self Care | Payer: BLUE CROSS/BLUE SHIELD | Source: Ambulatory Visit | Attending: Obstetrics and Gynecology | Admitting: Obstetrics and Gynecology

## 2017-07-15 ENCOUNTER — Encounter (HOSPITAL_COMMUNITY): Payer: Self-pay | Admitting: *Deleted

## 2017-07-15 DIAGNOSIS — M5441 Lumbago with sciatica, right side: Secondary | ICD-10-CM | POA: Insufficient documentation

## 2017-07-15 DIAGNOSIS — O26891 Other specified pregnancy related conditions, first trimester: Secondary | ICD-10-CM | POA: Diagnosis present

## 2017-07-15 DIAGNOSIS — Z3A10 10 weeks gestation of pregnancy: Secondary | ICD-10-CM | POA: Insufficient documentation

## 2017-07-15 DIAGNOSIS — M549 Dorsalgia, unspecified: Secondary | ICD-10-CM

## 2017-07-15 DIAGNOSIS — M545 Low back pain: Secondary | ICD-10-CM | POA: Diagnosis present

## 2017-07-15 DIAGNOSIS — M5431 Sciatica, right side: Secondary | ICD-10-CM

## 2017-07-15 DIAGNOSIS — O9989 Other specified diseases and conditions complicating pregnancy, childbirth and the puerperium: Secondary | ICD-10-CM

## 2017-07-15 HISTORY — DX: Unspecified ovarian cyst, unspecified side: N83.209

## 2017-07-15 LAB — URINALYSIS, ROUTINE W REFLEX MICROSCOPIC
BILIRUBIN URINE: NEGATIVE
Glucose, UA: NEGATIVE mg/dL
HGB URINE DIPSTICK: NEGATIVE
Ketones, ur: NEGATIVE mg/dL
NITRITE: NEGATIVE
PH: 6 (ref 5.0–8.0)
Protein, ur: NEGATIVE mg/dL
SPECIFIC GRAVITY, URINE: 1.021 (ref 1.005–1.030)

## 2017-07-15 MED ORDER — ACETAMINOPHEN 500 MG PO TABS
1000.0000 mg | ORAL_TABLET | Freq: Once | ORAL | Status: AC
  Administered 2017-07-15: 1000 mg via ORAL
  Filled 2017-07-15: qty 2

## 2017-07-15 NOTE — Discharge Instructions (Signed)
Back Exercises If you have pain in your back, do these exercises 2-3 times each day or as told by your doctor. When the pain goes away, do the exercises once each day, but repeat the steps more times for each exercise (do more repetitions). If you do not have pain in your back, do these exercises once each day or as told by your doctor. Exercises Single Knee to Chest  Do these steps 3-5 times in a row for each leg: 1. Lie on your back on a firm bed or the floor with your legs stretched out. 2. Bring one knee to your chest. 3. Hold your knee to your chest by grabbing your knee or thigh. 4. Pull on your knee until you feel a gentle stretch in your lower back. 5. Keep doing the stretch for 10-30 seconds. 6. Slowly let go of your leg and straighten it.  Pelvic Tilt  Do these steps 5-10 times in a row: 1. Lie on your back on a firm bed or the floor with your legs stretched out. 2. Bend your knees so they point up to the ceiling. Your feet should be flat on the floor. 3. Tighten your lower belly (abdomen) muscles to press your lower back against the floor. This will make your tailbone point up to the ceiling instead of pointing down to your feet or the floor. 4. Stay in this position for 5-10 seconds while you gently tighten your muscles and breathe evenly.  Cat-Cow  Do these steps until your lower back bends more easily: 1. Get on your hands and knees on a firm surface. Keep your hands under your shoulders, and keep your knees under your hips. You may put padding under your knees. 2. Let your head hang down, and make your tailbone point down to the floor so your lower back is round like the back of a cat. 3. Stay in this position for 5 seconds. 4. Slowly lift your head and make your tailbone point up to the ceiling so your back hangs low (sags) like the back of a cow. 5. Stay in this position for 5 seconds.  Press-Ups  Do these steps 5-10 times in a row: 1. Lie on your belly  (face-down) on the floor. 2. Place your hands near your head, about shoulder-width apart. 3. While you keep your back relaxed and keep your hips on the floor, slowly straighten your arms to raise the top half of your body and lift your shoulders. Do not use your back muscles. To make yourself more comfortable, you may change where you place your hands. 4. Stay in this position for 5 seconds. 5. Slowly return to lying flat on the floor.  Bridges  Do these steps 10 times in a row: 1. Lie on your back on a firm surface. 2. Bend your knees so they point up to the ceiling. Your feet should be flat on the floor. 3. Tighten your butt muscles and lift your butt off of the floor until your waist is almost as high as your knees. If you do not feel the muscles working in your butt and the back of your thighs, slide your feet 1-2 inches farther away from your butt. 4. Stay in this position for 3-5 seconds. 5. Slowly lower your butt to the floor, and let your butt muscles relax.  If this exercise is too easy, try doing it with your arms crossed over your chest. Belly Crunches  Do these steps 5-10 times  in a row: 1. Lie on your back on a firm bed or the floor with your legs stretched out. 2. Bend your knees so they point up to the ceiling. Your feet should be flat on the floor. 3. Cross your arms over your chest. 4. Tip your chin a little bit toward your chest but do not bend your neck. 5. Tighten your belly muscles and slowly raise your chest just enough to lift your shoulder blades a tiny bit off of the floor. 6. Slowly lower your chest and your head to the floor.  Back Lifts Do these steps 5-10 times in a row: 1. Lie on your belly (face-down) with your arms at your sides, and rest your forehead on the floor. 2. Tighten the muscles in your legs and your butt. 3. Slowly lift your chest off of the floor while you keep your hips on the floor. Keep the back of your head in line with the curve in your  back. Look at the floor while you do this. 4. Stay in this position for 3-5 seconds. 5. Slowly lower your chest and your face to the floor.  Contact a doctor if:  Your back pain gets a lot worse when you do an exercise.  Your back pain does not lessen 2 hours after you exercise. If you have any of these problems, stop doing the exercises. Do not do them again unless your doctor says it is okay. Get help right away if:  You have sudden, very bad back pain. If this happens, stop doing the exercises. Do not do them again unless your doctor says it is okay. This information is not intended to replace advice given to you by your health care provider. Make sure you discuss any questions you have with your health care provider. Document Released: 08/14/2010 Document Revised: 12/18/2015 Document Reviewed: 09/05/2014 Elsevier Interactive Patient Education  2018 Elsevier Inc. Sciatica Sciatica is pain, numbness, weakness, or tingling along the path of the sciatic nerve. The sciatic nerve starts in the lower back and runs down the back of each leg. The nerve controls the muscles in the lower leg and in the back of the knee. It also provides feeling (sensation) to the back of the thigh, the lower leg, and the sole of the foot. Sciatica is a symptom of another medical condition that pinches or puts pressure on the sciatic nerve. Generally, sciatica only affects one side of the body. Sciatica usually goes away on its own or with treatment. In some cases, sciatica may keep coming back (recur). What are the causes? This condition is caused by pressure on the sciatic nerve, or pinching of the sciatic nerve. This may be the result of:  A disk in between the bones of the spine (vertebrae) bulging out too far (herniated disk).  Age-related changes in the spinal disks (degenerative disk disease).  A pain disorder that affects a muscle in the buttock (piriformis syndrome).  Extra bone growth (bone spur) near  the sciatic nerve.  An injury or break (fracture) of the pelvis.  Pregnancy.  Tumor (rare).  What increases the risk? The following factors may make you more likely to develop this condition:  Playing sports that place pressure or stress on the spine, such as football or weight lifting.  Having poor strength and flexibility.  A history of back injury.  A history of back surgery.  Sitting for long periods of time.  Doing activities that involve repetitive bending or lifting.  Obesity.  What are the signs or symptoms? Symptoms can vary from mild to very severe, and they may include:  Any of these problems in the lower back, leg, hip, or buttock: ? Mild tingling or dull aches. ? Burning sensations. ? Sharp pains.  Numbness in the back of the calf or the sole of the foot.  Leg weakness.  Severe back pain that makes movement difficult.  These symptoms may get worse when you cough, sneeze, or laugh, or when you sit or stand for long periods of time. Being overweight may also make symptoms worse. In some cases, symptoms may recur over time. How is this diagnosed? This condition may be diagnosed based on:  Your symptoms.  A physical exam. Your health care provider may ask you to do certain movements to check whether those movements trigger your symptoms.  You may have tests, including: ? Blood tests. ? X-rays. ? MRI. ? CT scan.  How is this treated? In many cases, this condition improves on its own, without any treatment. However, treatment may include:  Reducing or modifying physical activity during periods of pain.  Exercising and stretching to strengthen your abdomen and improve the flexibility of your spine.  Icing and applying heat to the affected area.  Medicines that help: ? To relieve pain and swelling. ? To relax your muscles.  Injections of medicines that help to relieve pain, irritation, and inflammation around the sciatic nerve  (steroids).  Surgery.  Follow these instructions at home: Medicines  Take over-the-counter and prescription medicines only as told by your health care provider.  Do not drive or operate heavy machinery while taking prescription pain medicine. Managing pain  If directed, apply ice to the affected area. ? Put ice in a plastic bag. ? Place a towel between your skin and the bag. ? Leave the ice on for 20 minutes, 2-3 times a day.  After icing, apply heat to the affected area before you exercise or as often as told by your health care provider. Use the heat source that your health care provider recommends, such as a moist heat pack or a heating pad. ? Place a towel between your skin and the heat source. ? Leave the heat on for 20-30 minutes. ? Remove the heat if your skin turns bright red. This is especially important if you are unable to feel pain, heat, or cold. You may have a greater risk of getting burned. Activity  Return to your normal activities as told by your health care provider. Ask your health care provider what activities are safe for you. ? Avoid activities that make your symptoms worse.  Take brief periods of rest throughout the day. Resting in a lying or standing position is usually better than sitting to rest. ? When you rest for longer periods, mix in some mild activity or stretching between periods of rest. This will help to prevent stiffness and pain. ? Avoid sitting for long periods of time without moving. Get up and move around at least one time each hour.  Exercise and stretch regularly, as told by your health care provider.  Do not lift anything that is heavier than 10 lb (4.5 kg) while you have symptoms of sciatica. When you do not have symptoms, you should still avoid heavy lifting, especially repetitive heavy lifting.  When you lift objects, always use proper lifting technique, which includes: ? Bending your knees. ? Keeping the load close to your  body. ? Avoiding twisting. General instructions  Use good  posture. ? Avoid leaning forward while sitting. ? Avoid hunching over while standing.  Maintain a healthy weight. Excess weight puts extra stress on your back and makes it difficult to maintain good posture.  Wear supportive, comfortable shoes. Avoid wearing high heels.  Avoid sleeping on a mattress that is too soft or too hard. A mattress that is firm enough to support your back when you sleep may help to reduce your pain.  Keep all follow-up visits as told by your health care provider. This is important. Contact a health care provider if:  You have pain that wakes you up when you are sleeping.  You have pain that gets worse when you lie down.  Your pain is worse than you have experienced in the past.  Your pain lasts longer than 4 weeks.  You experience unexplained weight loss. Get help right away if:  You lose control of your bowel or bladder (incontinence).  You have: ? Weakness in your lower back, pelvis, buttocks, or legs that gets worse. ? Redness or swelling of your back. ? A burning sensation when you urinate. This information is not intended to replace advice given to you by your health care provider. Make sure you discuss any questions you have with your health care provider. Document Released: 07/06/2001 Document Revised: 12/16/2015 Document Reviewed: 03/21/2015 Elsevier Interactive Patient Education  2018 Elsevier Inc.  [place] [facility] Providers    [facility]       Phone: [phone]  [facility] [place] Phone: [phone]  [facility] [place]  Phone: [phone]  [facility]  Phone: [phone]  [facility] [place]  Phone: [phone]  [facility]       Phone: [phone]  [facility]     Phone: [phone]   [facility] [place]    Phone: [phone]  [facility]    Phone: [phone]  [facility]    Phone: [phone]  [facility]    Phone: [phone]  [facility]       Phone: [phone]   [facility]  Phone: [phone]  [facility]    Phone: [phone]  [facility]   Phone: [phone]  [facility]      Phone: [phone]  [facility]    Phone: [phone]  Safe Medications in Pregnancy   Acne: Benzoyl Peroxide Salicylic Acid  Backache/Headache: Tylenol: 2 regular strength every 4 hours OR              2 Extra strength every 6 hours  Colds/Coughs/Allergies: Benadryl (alcohol free) 25 mg every 6 hours as needed Breath right strips Claritin Cepacol throat lozenges Chloraseptic throat spray Cold-Eeze- up to three times per day Cough drops, alcohol free Flonase (by prescription only) Guaifenesin Mucinex Robitussin DM (plain only, alcohol free) Saline nasal spray/drops Sudafed (pseudoephedrine) & Actifed ** use only after [redacted] weeks gestation and if you do not have high blood pressure Tylenol Vicks Vaporub Zinc lozenges Zyrtec   Constipation: Colace Ducolax suppositories Fleet enema Glycerin suppositories Metamucil Milk of magnesia Miralax Senokot Smooth move tea  Diarrhea: Kaopectate Imodium A-D  *NO pepto Bismol  Hemorrhoids: Anusol Anusol HC Preparation H Tucks  Indigestion: Tums Maalox Mylanta Zantac  Pepcid  Insomnia: Benadryl (alcohol free) 25mg  every 6 hours as needed Tylenol PM  Unisom, no Gelcaps  Leg Cramps: Tums MagGel  Nausea/Vomiting:  Bonine Dramamine Emetrol Ginger extract Sea bands Meclizine  Nausea medication to take during pregnancy:  Unisom (doxylamine succinate 25  mg tablets) Take one tablet daily at bedtime. If symptoms are not adequately controlled, the dose can be increased to a maximum recommended dose of two tablets daily (1/2 tablet in the morning, 1/2 tablet mid-afternoon and one at bedtime). Vitamin B6 100mg  tablets. Take one tablet twice a day (up to 200 mg per day).  Skin Rashes: Aveeno products Benadryl cream or 25mg  every 6 hours as needed Calamine Lotion 1% cortisone cream  Yeast infection: Gyne-lotrimin 7 Monistat 7   **If taking multiple medications, please check labels to avoid duplicating the same active ingredients **take medication as directed on the label ** Do not exceed 4000 mg of tylenol in 24 hours **Do not take medications that contain aspirin or ibuprofen

## 2017-07-15 NOTE — MAU Note (Signed)
Wasn't sure if she should come here or [facility].  Thinks she pulled a muscle in her back.  Pain in lower back on rt side, sometimes shoots down her leg.  Was lifting something when occurred. Last night had some pain, tightening in her abd, ? Ctx.
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
## 2017-07-26 NOTE — L&D Delivery Note (Signed)
Patient is a 37 y.o. now G6P3 s/p NSVD at 8577w1d, who was admitted for IOL for post dates.  She progressed with augmentation (Pitocin) to complete. Complication of shoulder dystocia- maneuvers used McRoberts and Suprapubic pressure. Baby delivery with ease after maneuvers. Spontaneous cry after delivery of body. Cord clamping delayed by several minutes then clamped by CNM and cut by mother of patient.  Placenta intact and spontaneous, bleeding minimal.  Right labial laceration identified- not repaired, hemostatic.  Mom and baby stable prior to transfer to postpartum. She plans on breastfeeding. She requests Patch for birth control.  Delivery Note At 6:34 AM a viable and healthy female was delivered via Vaginal, Spontaneous (Presentation: compound, OA).  APGAR: 8, 9; weight 9lb 11.7oz Placenta intact and spontaneous, bleeding minimal. 3V Cord:  with the following complications of shoulder dystocia and macrosomia.   Anesthesia: Epidural Episiotomy: None Lacerations: Labial- right Suture Repair: none Est. Blood Loss (mL): 300  Mom to postpartum.  Baby to Couplet care / Skin to Skin.  Sharyon CableVeronica C Daenerys Glenn CNM 02/14/2018, 7:41 AM

## 2017-08-11 ENCOUNTER — Inpatient Hospital Stay (HOSPITAL_COMMUNITY)
Admission: AD | Admit: 2017-08-11 | Discharge: 2017-08-11 | Disposition: A | Discharge status: Home / Self Care | Payer: BLUE CROSS/BLUE SHIELD | Source: Ambulatory Visit | Attending: Obstetrics & Gynecology | Admitting: Obstetrics & Gynecology

## 2017-08-11 ENCOUNTER — Encounter (HOSPITAL_COMMUNITY): Payer: Self-pay

## 2017-08-11 DIAGNOSIS — Z79899 Other long term (current) drug therapy: Secondary | ICD-10-CM | POA: Diagnosis not present

## 2017-08-11 DIAGNOSIS — D649 Anemia, unspecified: Secondary | ICD-10-CM | POA: Insufficient documentation

## 2017-08-11 DIAGNOSIS — N898 Other specified noninflammatory disorders of vagina: Secondary | ICD-10-CM | POA: Diagnosis present

## 2017-08-11 DIAGNOSIS — O98812 Other maternal infectious and parasitic diseases complicating pregnancy, second trimester: Secondary | ICD-10-CM | POA: Diagnosis not present

## 2017-08-11 DIAGNOSIS — O2342 Unspecified infection of urinary tract in pregnancy, second trimester: Secondary | ICD-10-CM

## 2017-08-11 DIAGNOSIS — B373 Candidiasis of vulva and vagina: Secondary | ICD-10-CM | POA: Diagnosis not present

## 2017-08-11 DIAGNOSIS — Z3A14 14 weeks gestation of pregnancy: Secondary | ICD-10-CM | POA: Diagnosis not present

## 2017-08-11 DIAGNOSIS — O3482 Maternal care for other abnormalities of pelvic organs, second trimester: Secondary | ICD-10-CM | POA: Insufficient documentation

## 2017-08-11 DIAGNOSIS — N83209 Unspecified ovarian cyst, unspecified side: Secondary | ICD-10-CM | POA: Insufficient documentation

## 2017-08-11 DIAGNOSIS — R109 Unspecified abdominal pain: Secondary | ICD-10-CM | POA: Insufficient documentation

## 2017-08-11 DIAGNOSIS — O99012 Anemia complicating pregnancy, second trimester: Secondary | ICD-10-CM | POA: Diagnosis not present

## 2017-08-11 DIAGNOSIS — O26892 Other specified pregnancy related conditions, second trimester: Secondary | ICD-10-CM | POA: Diagnosis present

## 2017-08-11 DIAGNOSIS — B3731 Acute candidiasis of vulva and vagina: Secondary | ICD-10-CM

## 2017-08-11 HISTORY — DX: Urinary tract infection, site not specified: N39.0

## 2017-08-11 LAB — URINALYSIS, ROUTINE W REFLEX MICROSCOPIC
BILIRUBIN URINE: NEGATIVE
GLUCOSE, UA: NEGATIVE mg/dL
HGB URINE DIPSTICK: NEGATIVE
KETONES UR: NEGATIVE mg/dL
NITRITE: NEGATIVE
PROTEIN: NEGATIVE mg/dL
Specific Gravity, Urine: 1.028 (ref 1.005–1.030)
pH: 5 (ref 5.0–8.0)

## 2017-08-11 LAB — WET PREP, GENITAL
Clue Cells Wet Prep HPF POC: NONE SEEN
Sperm: NONE SEEN
TRICH WET PREP: NONE SEEN

## 2017-08-11 LAB — CBC
HCT: 26.1 % — ABNORMAL LOW (ref 36.0–46.0)
Hemoglobin: 8.1 g/dL — ABNORMAL LOW (ref 12.0–15.0)
MCH: 22.1 pg — ABNORMAL LOW (ref 26.0–34.0)
MCHC: 31 g/dL (ref 30.0–36.0)
MCV: 71.1 fL — ABNORMAL LOW (ref 78.0–100.0)
PLATELETS: 277 10*3/uL (ref 150–400)
RBC: 3.67 MIL/uL — ABNORMAL LOW (ref 3.87–5.11)
RDW: 17.7 % — AB (ref 11.5–15.5)
WBC: 8.9 10*3/uL (ref 4.0–10.5)

## 2017-08-11 MED ORDER — TERCONAZOLE 0.8 % VA CREA: 1.0000 | TOPICAL_CREAM | Freq: Every day | VAGINAL | 0 refills | Status: DC

## 2017-08-11 MED ORDER — FERROUS SULFATE 325 (65 FE) MG PO TABS: 325.0000 mg | ORAL_TABLET | Freq: Two times a day (BID) | ORAL | 2 refills | Status: DC

## 2017-08-11 MED ORDER — NITROFURANTOIN MONOHYD MACRO 100 MG PO CAPS: 100.0000 mg | ORAL_CAPSULE | Freq: Two times a day (BID) | ORAL | 0 refills | Status: DC

## 2017-08-11 NOTE — MAU Provider Note (Signed)
History     CSN: [id number]  Arrival date and time: 08/11/17 1844   First Provider Initiated Contact with Patient 08/11/17 2009      Chief Complaint  Patient presents with  . Vaginal Discharge  . Abdominal Pain   HPI Teresa Glenn is a 37 y.o. [id number] at 2451w4d who presents with abdominal pain and vaginal discharge. Symptoms began yesterday. Reports lower abdominal cramping. States she was concerned she may have a UTI. Rates pain 6/10. Has not treated symptoms. Describes as cramp like and intermittent. Nothing makes pain better or worse. Endorses urinary frequency. Denies n/v, fever/chills, hematuria, or flank pain.  Has noticed an increase in yellow mucoid vaginal discharge. Denies itching/irritation or vaginal bleeding.   OB History    Gravida Para Term Preterm AB Living   6 2 2  0 3 2   SAB TAB Ectopic Multiple Live Births   2 1 0 0 2      Past Medical History:  Diagnosis Date  . Ovarian cyst   . UTI (urinary tract infection)     Past Surgical History:  Procedure Laterality Date  . DILATION AND CURETTAGE OF UTERUS      Family History  Problem Relation Age of Onset  . Stroke Mother   . Diabetes Father   . Alzheimer's disease Maternal Grandmother   . Alzheimer's disease Paternal Grandmother     Social History   Tobacco Use  . Smoking status: Never Smoker  . Smokeless tobacco: Never Used  Substance Use Topics  . Alcohol use: No    Comment: occasional, prior to preg  . Drug use: No    Allergies: No Known Allergies  Medications Prior to Admission  Medication Sig Dispense Refill Last Dose  . acetaminophen (TYLENOL) 500 MG tablet Take 500 mg by mouth every 6 (six) hours as needed.   Past Month at Unknown time  . flintstones complete (FLINTSTONES) 60 MG chewable tablet Chew 1 tablet by mouth daily.   08/10/2017 at Unknown time  . pyridoxine (B-6) 100 MG tablet Take 100 mg by mouth 2 (two) times daily.   Past Week at Unknown time    Review of Systems   Constitutional: Negative.   Gastrointestinal: Positive for abdominal pain. Negative for diarrhea, nausea and vomiting.  Genitourinary: Positive for frequency, pelvic pain and vaginal discharge. Negative for dysuria, flank pain, hematuria and vaginal bleeding.  Neurological: Negative for headaches.   Physical Exam   Blood pressure 103/79, pulse 65, temperature 97.7 F (36.5 C), temperature source Oral, resp. rate 15, height 5' 5.5" (1.664 m), weight 137 lb (62.1 kg), last menstrual period 05/02/2017, SpO2 100 %.  Physical Exam  Nursing note and vitals reviewed. Constitutional: She is oriented to person, place, and time. She appears well-developed and well-nourished. No distress.  HENT:  Head: Normocephalic and atraumatic.  Eyes: Conjunctivae are normal. Right eye exhibits no discharge. Left eye exhibits no discharge. No scleral icterus.  Neck: Normal range of motion.  Cardiovascular: Normal rate, regular rhythm and normal heart sounds.  No murmur heard. Respiratory: Effort normal and breath sounds normal. No respiratory distress. She has no wheezes.  GI: Soft. There is no tenderness. There is no CVA tenderness.  Genitourinary: No bleeding in the vagina. Vaginal discharge found.  Genitourinary Comments: Cervix closed  Neurological: She is alert and oriented to person, place, and time.  Skin: Skin is warm and dry. She is not diaphoretic.  Psychiatric: She has a normal mood and affect. Her behavior is  normal. Judgment and thought content normal.    MAU Course  Procedures Results for orders placed or performed during the hospital encounter of 08/11/17 (from the past 24 hour(s))  Urinalysis, Routine w reflex microscopic     Status: Abnormal   Collection Time: 08/11/17  6:54 PM  Result Value Ref Range   Color, Urine YELLOW YELLOW   APPearance HAZY (A) CLEAR   Specific Gravity, Urine 1.028 1.005 - 1.030   pH 5.0 5.0 - 8.0   Glucose, UA NEGATIVE NEGATIVE mg/dL   Hgb urine dipstick  NEGATIVE NEGATIVE   Bilirubin Urine NEGATIVE NEGATIVE   Ketones, ur NEGATIVE NEGATIVE mg/dL   Protein, ur NEGATIVE NEGATIVE mg/dL   Nitrite NEGATIVE NEGATIVE   Leukocytes, UA TRACE (A) NEGATIVE   RBC / HPF 0-5 0 - 5 RBC/hpf   WBC, UA 0-5 0 - 5 WBC/hpf   Bacteria, UA RARE (A) NONE SEEN   Squamous Epithelial / LPF 0-5 (A) NONE SEEN   Mucus PRESENT   CBC     Status: Abnormal   Collection Time: 08/11/17  7:26 PM  Result Value Ref Range   WBC 8.9 4.0 - 10.5 K/uL   RBC 3.67 (L) 3.87 - 5.11 MIL/uL   Hemoglobin 8.1 (L) 12.0 - 15.0 g/dL   HCT 16.1 (L) 09.6 - 04.5 %   MCV 71.1 (L) 78.0 - 100.0 fL   MCH 22.1 (L) 26.0 - 34.0 pg   MCHC 31.0 30.0 - 36.0 g/dL   RDW 40.9 (H) 81.1 - 91.4 %   Platelets 277 150 - 400 K/uL  Wet prep, genital     Status: Abnormal   Collection Time: 08/11/17  8:37 PM  Result Value Ref Range   Yeast Wet Prep HPF POC PRESENT (A) NONE SEEN   Trich, Wet Prep NONE SEEN NONE SEEN   Clue Cells Wet Prep HPF POC NONE SEEN NONE SEEN   WBC, Wet Prep HPF POC MANY (A) NONE SEEN   Sperm NONE SEEN     MDM FHT 141 Urine culture sent GC/Ct & wet prep collected Cervix closed Hgb dropped from previous check -- pt not taking iron --- will start on iron supplement until her f/u with OB  Assessment and Plan  A:  1. Vaginal yeast infection   2. Urinary tract infection in mother during second trimester of pregnancy   3. [redacted] weeks gestation of pregnancy   4. Anemia affecting pregnancy in second trimester    P: Discharge home Rx ferrous sulfate BID, terazol, & macrobid Urine culture & GC/CT pending Discussed reasons to return to MAU F/u with OB  Judeth Horn 08/11/2017, 8:09 PM

## 2017-08-11 NOTE — Discharge Instructions (Signed)
Pregnancy and Urinary Tract Infection What is a urinary tract infection? A urinary tract infection (UTI) is an infection of any part of the urinary tract. This includes the kidneys, the tubes that connect your kidneys to your bladder (ureters), the bladder, and the tube that carries urine out of your body (urethra). These organs make, store, and get rid of urine in the body. A UTI can be a bladder infection (cystitis) or a kidney infection (pyelonephritis). This infection may be caused by fungi, viruses, and bacteria. Bacteria are the most common cause of UTIs. You are more likely to develop a UTI during pregnancy because:  The physical and hormonal changes your body goes through can make it easier for bacteria to get into your urinary tract.  Your growing baby puts pressure on your uterus and can affect urine flow.  Does a UTI place my baby at risk? An untreated UTI during pregnancy could lead to a kidney infection, which can cause health problems that could affect your baby. Possible complications of an untreated UTI include:  Having your baby before 37 weeks of pregnancy (premature).  Having a baby with a low birth weight.  Developing high blood pressure during pregnancy (preeclampsia).  What are the symptoms of a UTI? Symptoms of a UTI include:  Fever.  Frequent urination or passing small amounts of urine frequently.  Needing to urinate urgently.  Pain or a burning sensation with urination.  Urine that smells bad or unusual.  Cloudy urine.  Pain in the lower abdomen or back.  Trouble urinating.  Blood in the urine.  Vomiting or being less hungry than normal.  Diarrhea or abdominal pain.  Vaginal discharge.  What are the treatment options for a UTI during pregnancy? Treatment for this condition may include:  Antibiotic medicines that are safe to take during pregnancy.  Other medicines to treat less common causes of UTI.  How can I prevent a UTI?  To prevent a  UTI:  Go to the bathroom as soon as you feel the need.  Always wipe from front to back.  Wash your genital area with soap and warm water daily.  Empty your bladder before and after sex.  Wear cotton underwear.  Limit your intake of high sugar foods or drinks, such as regular soda, juice, and sweets.  Drink 6-8 glasses of water daily.  Do not wear tight-fitting pants.  Do not douche or use deodorant sprays.  Do not drink alcohol, caffeine, or carbonated drinks. These can irritate the bladder.  Contact a health care provider if:  Your symptoms do not improve or get worse.  You have a fever after two days of treatment.  You have a rash.  You have abnormal vaginal discharge.  You have back or side pain.  You have chills.  You have nausea and vomiting. Get help right away if: Seek immediate medical care if you are pregnant and:  You feel contractions in your uterus.  You have lower belly pain.  You have a gush of fluid from your vagina.  You have blood in your urine.  You are vomiting and cannot keep down any medicines or water.  This information is not intended to replace advice given to you by your health care provider. Make sure you discuss any questions you have with your health care provider. Document Released: 11/06/2010 Document Revised: 06/25/2016 Document Reviewed: 06/02/2015 Elsevier Interactive Patient Education  2017 [place]. Anemia Anemia is a condition in which you do not have enough  red blood cells or hemoglobin. Hemoglobin is a substance in red blood cells that carries oxygen. When you do not have enough red blood cells or hemoglobin (are anemic), your body cannot get enough oxygen and your organs may not work properly. As a result, you may feel very tired or have other problems. What are the causes? Common causes of anemia include:  Excessive bleeding. Anemia can be caused by excessive bleeding inside or outside the body, including bleeding  from the intestine or from periods in women.  Poor nutrition.  Long-lasting (chronic) kidney, thyroid, and liver disease.  Bone marrow disorders.  Cancer and treatments for cancer.  HIV (human immunodeficiency virus) and AIDS (acquired immunodeficiency syndrome).  Treatments for HIV and AIDS.  Spleen problems.  Blood disorders.  Infections, medicines, and autoimmune disorders that destroy red blood cells.  What are the signs or symptoms? Symptoms of this condition include:  Minor weakness.  Dizziness.  Headache.  Feeling heartbeats that are irregular or faster than normal (palpitations).  Shortness of breath, especially with exercise.  Paleness.  Cold sensitivity.  Indigestion.  Nausea.  Difficulty sleeping.  Difficulty concentrating.  Symptoms may occur suddenly or develop slowly. If your anemia is mild, you may not have symptoms. How is this diagnosed? This condition is diagnosed based on:  Blood tests.  Your medical history.  A physical exam.  Bone marrow biopsy.  Your health care provider may also check your stool (feces) for blood and may do additional testing to look for the cause of your bleeding. You may also have other tests, including:  Imaging tests, such as a CT scan or MRI.  Endoscopy.  Colonoscopy.  How is this treated? Treatment for this condition depends on the cause. If you continue to lose a lot of blood, you may need to be treated at a hospital. Treatment may include:  Taking supplements of iron, vitamin C37, or folic acid.  Taking a hormone medicine (erythropoietin) that can help to stimulate red blood cell growth.  Having a blood transfusion. This may be needed if you lose a lot of blood.  Making changes to your diet.  Having surgery to remove your spleen.  Follow these instructions at home:  Take over-the-counter and prescription medicines only as told by your health care provider.  Take supplements only as told  by your health care provider.  Follow any diet instructions that you were given.  Keep all follow-up visits as told by your health care provider. This is important. Contact a health care provider if:  You develop new bleeding anywhere in the body. Get help right away if:  You are very weak.  You are short of breath.  You have pain in your abdomen or chest.  You are dizzy or feel faint.  You have trouble concentrating.  You have bloody or black, tarry stools.  You vomit repeatedly or you vomit up blood. Summary  Anemia is a condition in which you do not have enough red blood cells or enough of a substance in your red blood cells that carries oxygen (hemoglobin).  Symptoms may occur suddenly or develop slowly.  If your anemia is mild, you may not have symptoms.  This condition is diagnosed with blood tests as well as a medical history and physical exam. Other tests may be needed.  Treatment for this condition depends on the cause of the anemia. This information is not intended to replace advice given to you by your health care provider. Make sure you  discuss any questions you have with your health care provider. Document Released: 08/19/2004 Document Revised: 08/13/2016 Document Reviewed: 08/13/2016 Elsevier Interactive Patient Education  [employer].

## 2017-08-11 NOTE — MAU Note (Signed)
Pt reports vaginal discharge and sharp pains off/on in lower abd. Normal bowel movements.

## 2017-08-12 ENCOUNTER — Encounter (HOSPITAL_COMMUNITY): Payer: Self-pay | Admitting: Student

## 2017-08-12 LAB — GC/CHLAMYDIA PROBE AMP (~~LOC~~) NOT AT ARMC
Chlamydia: NEGATIVE
Neisseria Gonorrhea: NEGATIVE

## 2017-08-13 LAB — CULTURE, OB URINE

## 2017-09-08 LAB — OB RESULTS CONSOLE RUBELLA ANTIBODY, IGM: Rubella: IMMUNE

## 2017-09-08 LAB — OB RESULTS CONSOLE ABO/RH: RH TYPE: POSITIVE

## 2017-09-08 LAB — OB RESULTS CONSOLE HIV ANTIBODY (ROUTINE TESTING): HIV: NONREACTIVE

## 2017-09-08 LAB — OB RESULTS CONSOLE RPR: RPR: NONREACTIVE

## 2017-09-08 LAB — OB RESULTS CONSOLE GC/CHLAMYDIA
Chlamydia: NEGATIVE
Gonorrhea: NEGATIVE

## 2017-09-08 LAB — OB RESULTS CONSOLE ANTIBODY SCREEN: Antibody Screen: NEGATIVE

## 2017-09-08 LAB — OB RESULTS CONSOLE HEPATITIS B SURFACE ANTIGEN: HEP B S AG: NEGATIVE

## 2017-11-17 ENCOUNTER — Encounter (HOSPITAL_COMMUNITY): Payer: Self-pay

## 2017-11-17 ENCOUNTER — Other Ambulatory Visit: Payer: Self-pay

## 2017-11-17 ENCOUNTER — Inpatient Hospital Stay (HOSPITAL_COMMUNITY)
Admission: AD | Admit: 2017-11-17 | Discharge: 2017-11-17 | Disposition: A | Discharge status: Home / Self Care | Payer: BLUE CROSS/BLUE SHIELD | Source: Ambulatory Visit | Attending: Obstetrics & Gynecology | Admitting: Obstetrics & Gynecology

## 2017-11-17 ENCOUNTER — Inpatient Hospital Stay (HOSPITAL_COMMUNITY): Payer: BLUE CROSS/BLUE SHIELD

## 2017-11-17 DIAGNOSIS — O26893 Other specified pregnancy related conditions, third trimester: Secondary | ICD-10-CM | POA: Diagnosis not present

## 2017-11-17 DIAGNOSIS — Z3A28 28 weeks gestation of pregnancy: Secondary | ICD-10-CM | POA: Insufficient documentation

## 2017-11-17 DIAGNOSIS — R0602 Shortness of breath: Secondary | ICD-10-CM | POA: Diagnosis present

## 2017-11-17 DIAGNOSIS — Z889 Allergy status to unspecified drugs, medicaments and biological substances status: Secondary | ICD-10-CM

## 2017-11-17 DIAGNOSIS — F419 Anxiety disorder, unspecified: Secondary | ICD-10-CM

## 2017-11-17 LAB — CBC
HEMATOCRIT: 33.2 % — AB (ref 36.0–46.0)
Hemoglobin: 10.3 g/dL — ABNORMAL LOW (ref 12.0–15.0)
MCH: 24 pg — ABNORMAL LOW (ref 26.0–34.0)
MCHC: 31 g/dL (ref 30.0–36.0)
MCV: 77.2 fL — ABNORMAL LOW (ref 78.0–100.0)
Platelets: 172 10*3/uL (ref 150–400)
RBC: 4.3 MIL/uL (ref 3.87–5.11)
RDW: 17.7 % — ABNORMAL HIGH (ref 11.5–15.5)
WBC: 9.6 10*3/uL (ref 4.0–10.5)

## 2017-11-17 LAB — URINALYSIS, ROUTINE W REFLEX MICROSCOPIC
Bilirubin Urine: NEGATIVE
Glucose, UA: NEGATIVE mg/dL
Hgb urine dipstick: NEGATIVE
Ketones, ur: NEGATIVE mg/dL
Nitrite: NEGATIVE
PROTEIN: NEGATIVE mg/dL
Specific Gravity, Urine: 1.004 — ABNORMAL LOW (ref 1.005–1.030)
pH: 6 (ref 5.0–8.0)

## 2017-11-17 LAB — COMPREHENSIVE METABOLIC PANEL
ALBUMIN: 2.8 g/dL — AB (ref 3.5–5.0)
ALK PHOS: 59 U/L (ref 38–126)
ALT: 13 U/L — AB (ref 14–54)
AST: 22 U/L (ref 15–41)
Anion gap: 10 (ref 5–15)
BILIRUBIN TOTAL: 0.5 mg/dL (ref 0.3–1.2)
BUN: 5 mg/dL — AB (ref 6–20)
CALCIUM: 9 mg/dL (ref 8.9–10.3)
CO2: 22 mmol/L (ref 22–32)
CREATININE: 0.59 mg/dL (ref 0.44–1.00)
Chloride: 104 mmol/L (ref 101–111)
GFR calc Af Amer: >60 mL/min (ref >60)
GFR calc non Af Amer: >60 mL/min (ref >60)
GLUCOSE: 85 mg/dL (ref 65–99)
Potassium: 4.3 mmol/L (ref 3.5–5.1)
Sodium: 136 mmol/L (ref 135–145)
TOTAL PROTEIN: 6.6 g/dL (ref 6.5–8.1)

## 2017-11-17 MED ORDER — IOPAMIDOL (ISOVUE-370) INJECTION 76%
100.0000 mL | Freq: Once | INTRAVENOUS | Status: AC | PRN
  Administered 2017-11-17: 100 mL via INTRAVENOUS

## 2017-11-17 NOTE — MAU Provider Note (Addendum)
Chief Complaint:  Shortness of Breath   First Provider Initiated Contact with Patient 11/17/17 1756      HPI: Teresa Glenn is a 37 y.o. [id number] at [redacted]w[redacted]d who presents to maternity admissions reporting sudden onset of shortness of breath 3 days ago associated with chest pressure.  She reports she had a similar episode several weeks ago that resolved within a few hours. This episode has persisted 3 days.  She has to sit up all the time, including to sleep She feels short of breath lying down, even when lying on her right or left sides.  She denies any history of asthma or recent respiratory infection.  She denies cardiac history. There are no other associated symptoms.  She has not tried any treatments.  She never had this symptom in her previous pregnancies. She does report that a co-worker lost a baby at 22 weeks and this has her worried. She reports treatment for one panic attack many years ago but denies any other hx of anxiety.  She took benadryl for seasonal allergies 2 weeks ago but her allergies improved so she has not taken any medications in 2 weeks.   She reports good fetal movement, denies abdominal pain, LOF, vaginal bleeding, vaginal itching/burning, urinary symptoms, h/a, dizziness, n/v, or fever/chills.    HPI  Past Medical History: Past Medical History:  Diagnosis Date  . Ovarian cyst   . UTI (urinary tract infection)     Past obstetric history: OB History  Gravida Para Term Preterm AB Living  6 2 2  0 3 2  SAB TAB Ectopic Multiple Live Births  2 1 0 0 2    # Outcome Date GA Lbr Len/2nd Weight Sex Delivery Anes PTL Lv  6 Current           5 TAB           4 SAB           3 SAB           2 Term      Vag-Spont  N LIV  1 Term      Vag-Spont   LIV    Past Surgical History: Past Surgical History:  Procedure Laterality Date  . DILATION AND CURETTAGE OF UTERUS      Family History: Family History  Problem Relation Age of Onset  . Stroke Mother   . Diabetes  Father   . Alzheimer's disease Maternal Grandmother   . Alzheimer's disease Paternal Grandmother     Social History: Social History   Tobacco Use  . Smoking status: Never Smoker  . Smokeless tobacco: Never Used  Substance Use Topics  . Alcohol use: No    Comment: occasional, prior to preg  . Drug use: No    Allergies: No Known Allergies  Meds:  Medications Prior to Admission  Medication Sig Dispense Refill Last Dose  . acetaminophen (TYLENOL) 500 MG tablet Take 500 mg by mouth every 6 (six) hours as needed for mild pain.    11/16/2017 at Unknown time  . diphenhydrAMINE (BENADRYL) 12.5 MG/5ML elixir Take 25 mg by mouth daily as needed for allergies.   Past Month at Unknown time  . ferrous sulfate 325 (65 FE) MG tablet Take 1 tablet (325 mg total) by mouth 2 (two) times daily with a meal. 60 tablet 2 Past Week at Unknown time  . Prenatal Vit-Fe Fumarate-FA (PRENATAL MULTIVITAMIN) TABS tablet Take 1 tablet by mouth daily at 12 noon.   11/16/2017  at Unknown time  . nitrofurantoin, macrocrystal-monohydrate, (MACROBID) 100 MG capsule Take 1 capsule (100 mg total) by mouth 2 (two) times daily. (Patient not taking: Reported on 11/17/2017) 14 capsule 0 Completed Course at Unknown time  . terconazole (TERAZOL 3) 0.8 % vaginal cream Place 1 applicator vaginally at bedtime. (Patient not taking: Reported on 11/17/2017) 20 g 0 Completed Course at Unknown time    ROS:  Review of Systems  Constitutional: Negative for chills, fatigue and fever.  HENT: Negative for congestion.   Eyes: Negative for visual disturbance.  Respiratory: Positive for chest tightness and shortness of breath. Negative for cough.   Cardiovascular: Positive for chest pain. Negative for palpitations.  Gastrointestinal: Negative for abdominal pain, nausea and vomiting.  Genitourinary: Negative for difficulty urinating, dysuria, flank pain, pelvic pain, vaginal bleeding, vaginal discharge and vaginal pain.  Neurological: Negative  for dizziness and headaches.  Psychiatric/Behavioral: Negative.      I have reviewed patient's Past Medical Hx, Surgical Hx, Family Hx, Social Hx, medications and allergies.   Physical Exam   Patient Vitals for the past 24 hrs:  BP Temp Temp src Pulse Resp SpO2 Height Weight  11/17/17 1848 - - - - - 100 % - -  11/17/17 1744 - - - - - 100 % - -  11/17/17 1701 112/74 98.5 F (36.9 C) Oral 78 18 - 5' 5.5" (1.664 m) 153 lb (69.4 kg)   Constitutional: Well-developed, well-nourished female in no acute distress.  HEART: normal rate, heart sounds, regular rhythm RESP: normal effort, lung sounds clear and equal bilaterally GI: Abd soft, non-tender, gravid appropriate for gestational age.  MS: Extremities nontender, no edema, normal ROM Neurologic: Alert and oriented x 4.  GU: Neg CVAT.  EKG: normal EKG, normal sinus rhythm.    FHT:  Baseline 135 , moderate variability, accelerations present, no decelerations Contractions: None on toco or to palpation   Labs: Results for orders placed or performed during the hospital encounter of 11/17/17 (from the past 24 hour(s))  Urinalysis, Routine w reflex microscopic     Status: Abnormal   Collection Time: 11/17/17  5:05 PM  Result Value Ref Range   Color, Urine STRAW (A) YELLOW   APPearance CLEAR CLEAR   Specific Gravity, Urine 1.004 (L) 1.005 - 1.030   pH 6.0 5.0 - 8.0   Glucose, UA NEGATIVE NEGATIVE mg/dL   Hgb urine dipstick NEGATIVE NEGATIVE   Bilirubin Urine NEGATIVE NEGATIVE   Ketones, ur NEGATIVE NEGATIVE mg/dL   Protein, ur NEGATIVE NEGATIVE mg/dL   Nitrite NEGATIVE NEGATIVE   Leukocytes, UA SMALL (A) NEGATIVE   RBC / HPF 0-5 0 - 5 RBC/hpf   WBC, UA 0-5 0 - 5 WBC/hpf   Bacteria, UA RARE (A) NONE SEEN   Squamous Epithelial / LPF 0-5 0 - 5  CBC     Status: Abnormal   Collection Time: 11/17/17  5:59 PM  Result Value Ref Range   WBC 9.6 4.0 - 10.5 K/uL   RBC 4.30 3.87 - 5.11 MIL/uL   Hemoglobin 10.3 (L) 12.0 - 15.0 g/dL   HCT  19.133.2 (L) 47.836.0 - 46.0 %   MCV 77.2 (L) 78.0 - 100.0 fL   MCH 24.0 (L) 26.0 - 34.0 pg   MCHC 31.0 30.0 - 36.0 g/dL   RDW 29.517.7 (H) 62.111.5 - 30.815.5 %   Platelets 172 150 - 400 K/uL  Comprehensive metabolic panel     Status: Abnormal   Collection Time: 11/17/17  5:59 PM  Result Value Ref Range   Sodium 136 135 - 145 mmol/L   Potassium 4.3 3.5 - 5.1 mmol/L   Chloride 104 101 - 111 mmol/L   CO2 22 22 - 32 mmol/L   Glucose, Bld 85 65 - 99 mg/dL   BUN 5 (L) 6 - 20 mg/dL   Creatinine, Ser 1.61 0.44 - 1.00 mg/dL   Calcium 9.0 8.9 - 09.6 mg/dL   Total Protein 6.6 6.5 - 8.1 g/dL   Albumin 2.8 (L) 3.5 - 5.0 g/dL   AST 22 15 - 41 U/L   ALT 13 (L) 14 - 54 U/L   Alkaline Phosphatase 59 38 - 126 U/L   Total Bilirubin 0.5 0.3 - 1.2 mg/dL   GFR calc non Af Amer >60 >60 mL/min   GFR calc Af Amer >60 >60 mL/min   Anion gap 10 5 - 15      Imaging:  No results found.  MAU Course/MDM: I have ordered labs and reviewed results.  NST reviewed and reactive Pt with 100% O2 sats on RA, no tachycardia.  Pt appears distressed, and is unable to lie back in bed for FHR monitoring, needs to sit upright or she feels short of breath CT scan to rule out PE ordered  CT pending Report to Wynelle Bourgeois, CNM   Sharen Counter Certified Nurse-Midwife 11/17/2017 7:01 PM   Ct Angio Chest Pe W Or Wo Contrast  Result Date: 11/17/2017 CLINICAL DATA:  Sudden onset shortness of breath, patient is [redacted] weeks pregnant EXAM: CT ANGIOGRAPHY CHEST WITH CONTRAST TECHNIQUE: Multidetector CT imaging of the chest was performed using the standard protocol during bolus administration of intravenous contrast. Multiplanar CT image reconstructions and MIPs were obtained to evaluate the vascular anatomy. CONTRAST:  ISOVUE-370 IOPAMIDOL (ISOVUE-370) INJECTION 76% COMPARISON:  None. FINDINGS: Cardiovascular: Satisfactory opacification of the pulmonary arteries to the segmental level. No evidence of pulmonary embolism. Normal heart  size. No pericardial effusion. Nonaneurysmal aorta. No dissection. Mediastinum/Nodes: No enlarged mediastinal, hilar, or axillary lymph nodes. Thyroid gland, trachea, and esophagus demonstrate no significant findings. Lungs/Pleura: Lungs are clear. No pleural effusion or pneumothorax. Upper Abdomen: No acute abnormality. Small hyperenhancing focus near the dome of the liver, could represent small flash hemangioma. Musculoskeletal: No chest wall abnormality. No acute or significant osseous findings. Review of the MIP images confirms the above findings. IMPRESSION: 1. Negative for acute pulmonary embolus or aortic dissection 2. Clear lung fields Electronically Signed   By: Jasmine Pang M.D.   On: 11/17/2017 22:06   Reviewed with patient.  SOB probably related to anxiety. May use Benadryl for that (refused Rx for Vistaril) May use Claritin for allergies .Encouraged to return here or to other Urgent Care/ED if she develops worsening of symptoms, increase in pain, fever, or other concerning symptoms.    Aviva Signs, CNM

## 2017-11-17 NOTE — Discharge Instructions (Signed)
Allergies An allergy is when your body reacts to a substance in a way that is not normal. An allergic reaction can happen after you:  Eat something.  Breathe in something.  Touch something.  You can be allergic to:  Things that are only around during certain seasons, like molds and pollens.  Foods.  Drugs.  Insects.  Animal dander.  What are the signs or symptoms?  Puffiness (swelling). This may happen on the lips, face, tongue, mouth, or throat.  Sneezing.  Coughing.  Breathing loudly (wheezing).  Stuffy nose.  Tingling in the mouth.  A rash.  Itching.  Itchy, red, puffy areas of skin (hives).  Watery eyes.  Throwing up (vomiting).  Watery poop (diarrhea).  Dizziness.  Feeling faint or fainting.  Trouble breathing or swallowing.  A tight feeling in the chest.  A fast heartbeat. How is this diagnosed? Allergies can be diagnosed with:  A medical and family history.  Skin tests.  Blood tests.  A food diary. A food diary is a record of all the foods, drinks, and symptoms you have each day.  The results of an elimination diet. This diet involves making sure not to eat certain foods and then seeing what happens when you start eating them again.  How is this treated? There is no cure for allergies, but allergic reactions can be treated with medicine. Severe reactions usually need to be treated at a hospital. How is this prevented? The best way to prevent an allergic reaction is to avoid the thing you are allergic to. Allergy shots and medicines can also help prevent reactions in some cases. This information is not intended to replace advice given to you by your health care provider. Make sure you discuss any questions you have with your health care provider. Document Released: 11/06/2012 Document Revised: 03/08/2016 Document Reviewed: 04/23/2014 Elsevier Interactive Patient Education  2018 [employer]  Generalized Anxiety Disorder,  Adult Generalized anxiety disorder (GAD) is a mental health disorder. People with this condition constantly worry about everyday events. Unlike normal anxiety, worry related to GAD is not triggered by a specific event. These worries also do not fade or get better with time. GAD interferes with life functions, including relationships, work, and school. GAD can vary from mild to severe. People with severe GAD can have intense waves of anxiety with physical symptoms (panic attacks). What are the causes? The exact cause of GAD is not known. What increases the risk? This condition is more likely to develop in:  Women.  People who have a family history of anxiety disorders.  People who are very shy.  People who experience very stressful life events, such as the death of a loved one.  People who have a very stressful family environment.  What are the signs or symptoms? People with GAD often worry excessively about many things in their lives, such as their health and family. They may also be overly concerned about:  Doing well at work.  Being on time.  Natural disasters.  Friendships.  Physical symptoms of GAD include:  Fatigue.  Muscle tension or having muscle twitches.  Trembling or feeling shaky.  Being easily startled.  Feeling like your heart is pounding or racing.  Feeling out of breath or like you cannot take a deep breath.  Having trouble falling asleep or staying asleep.  Sweating.  Nausea, diarrhea, or irritable bowel syndrome (IBS).  Headaches.  Trouble concentrating or remembering facts.  Restlessness.  Irritability.  How is this diagnosed?  Your health care provider can diagnose GAD based on your symptoms and medical history. You will also have a physical exam. The health care provider will ask specific questions about your symptoms, including how severe they are, when they started, and if they come and go. Your health care provider may ask you about your  use of alcohol or drugs, including prescription medicines. Your health care provider may refer you to a mental health specialist for further evaluation. Your health care provider will do a thorough examination and may perform additional tests to rule out other possible causes of your symptoms. To be diagnosed with GAD, a person must have anxiety that:  Is out of his or her control.  Affects several different aspects of his or her life, such as work and relationships.  Causes distress that makes him or her unable to take part in normal activities.  Includes at least three physical symptoms of GAD, such as restlessness, fatigue, trouble concentrating, irritability, muscle tension, or sleep problems.  Before your health care provider can confirm a diagnosis of GAD, these symptoms must be present more days than they are not, and they must last for six months or longer. How is this treated? The following therapies are usually used to treat GAD:  Medicine. Antidepressant medicine is usually prescribed for long-term daily control. Antianxiety medicines may be added in severe cases, especially when panic attacks occur.  Talk therapy (psychotherapy). Certain types of talk therapy can be helpful in treating GAD by providing support, education, and guidance. Options include: ? Cognitive behavioral therapy (CBT). People learn coping skills and techniques to ease their anxiety. They learn to identify unrealistic or negative thoughts and behaviors and to replace them with positive ones. ? Acceptance and commitment therapy (ACT). This treatment teaches people how to be mindful as a way to cope with unwanted thoughts and feelings. ? Biofeedback. This process trains you to manage your body's response (physiological response) through breathing techniques and relaxation methods. You will work with a therapist while machines are used to monitor your physical symptoms.  Stress management techniques. These include  yoga, meditation, and exercise.  A mental health specialist can help determine which treatment is best for you. Some people see improvement with one type of therapy. However, other people require a combination of therapies. Follow these instructions at home:  Take over-the-counter and prescription medicines only as told by your health care provider.  Try to maintain a normal routine.  Try to anticipate stressful situations and allow extra time to manage them.  Practice any stress management or self-calming techniques as taught by your health care provider.  Do not punish yourself for setbacks or for not making progress.  Try to recognize your accomplishments, even if they are small.  Keep all follow-up visits as told by your health care provider. This is important. Contact a health care provider if:  Your symptoms do not get better.  Your symptoms get worse.  You have signs of depression, such as: ? A persistently sad, cranky, or irritable mood. ? Loss of enjoyment in activities that used to bring you joy. ? Change in weight or eating. ? Changes in sleeping habits. ? Avoiding friends or family members. ? Loss of energy for normal tasks. ? Feelings of guilt or worthlessness. Get help right away if:  You have serious thoughts about hurting yourself or others. If you ever feel like you may hurt yourself or others, or have thoughts about taking your own life, get help  right away. You can go to your nearest emergency department or call:  Your local emergency services (911 in the [place]  A suicide crisis helpline, such as the National Suicide Prevention Lifeline at (737)109-4765. This is open 24 hours a day.  Summary  Generalized anxiety disorder (GAD) is a mental health disorder that involves worry that is not triggered by a specific event.  People with GAD often worry excessively about many things in their lives, such as their health and family.  GAD may cause physical  symptoms such as restlessness, trouble concentrating, sleep problems, frequent sweating, nausea, diarrhea, headaches, and trembling or muscle twitching.  A mental health specialist can help determine which treatment is best for you. Some people see improvement with one type of therapy. However, other people require a combination of therapies. This information is not intended to replace advice given to you by your health care provider. Make sure you discuss any questions you have with your health care provider. Document Released: 11/06/2012 Document Revised: 06/01/2016 Document Reviewed: 06/01/2016 Elsevier Interactive Patient Education  [employer].

## 2017-11-17 NOTE — MAU Note (Signed)
Pt reports she has been having SOB for the past few days. C/o some chest pain and discomfort Pt reports feeling tired as well.

## 2017-12-05 ENCOUNTER — Inpatient Hospital Stay (HOSPITAL_COMMUNITY)
Admission: AD | Admit: 2017-12-05 | Discharge: 2017-12-05 | Disposition: A | Discharge status: Home / Self Care | Payer: BLUE CROSS/BLUE SHIELD | Source: Ambulatory Visit | Attending: Obstetrics and Gynecology | Admitting: Obstetrics and Gynecology

## 2017-12-05 ENCOUNTER — Encounter (HOSPITAL_COMMUNITY): Payer: Self-pay | Admitting: *Deleted

## 2017-12-05 DIAGNOSIS — R102 Pelvic and perineal pain: Secondary | ICD-10-CM | POA: Insufficient documentation

## 2017-12-05 DIAGNOSIS — Z823 Family history of stroke: Secondary | ICD-10-CM | POA: Insufficient documentation

## 2017-12-05 DIAGNOSIS — O26893 Other specified pregnancy related conditions, third trimester: Secondary | ICD-10-CM | POA: Diagnosis not present

## 2017-12-05 DIAGNOSIS — Z3689 Encounter for other specified antenatal screening: Secondary | ICD-10-CM

## 2017-12-05 DIAGNOSIS — Z833 Family history of diabetes mellitus: Secondary | ICD-10-CM | POA: Diagnosis not present

## 2017-12-05 DIAGNOSIS — R1030 Lower abdominal pain, unspecified: Secondary | ICD-10-CM | POA: Diagnosis present

## 2017-12-05 DIAGNOSIS — O368130 Decreased fetal movements, third trimester, not applicable or unspecified: Secondary | ICD-10-CM | POA: Diagnosis not present

## 2017-12-05 DIAGNOSIS — Z87440 Personal history of urinary (tract) infections: Secondary | ICD-10-CM | POA: Diagnosis not present

## 2017-12-05 DIAGNOSIS — O368190 Decreased fetal movements, unspecified trimester, not applicable or unspecified: Secondary | ICD-10-CM | POA: Diagnosis not present

## 2017-12-05 DIAGNOSIS — R109 Unspecified abdominal pain: Secondary | ICD-10-CM | POA: Diagnosis not present

## 2017-12-05 DIAGNOSIS — Z3A31 31 weeks gestation of pregnancy: Secondary | ICD-10-CM

## 2017-12-05 DIAGNOSIS — N949 Unspecified condition associated with female genital organs and menstrual cycle: Secondary | ICD-10-CM

## 2017-12-05 LAB — URINALYSIS, ROUTINE W REFLEX MICROSCOPIC
Bilirubin Urine: NEGATIVE
Glucose, UA: NEGATIVE mg/dL
Hgb urine dipstick: NEGATIVE
Ketones, ur: NEGATIVE mg/dL
NITRITE: NEGATIVE
PROTEIN: NEGATIVE mg/dL
Specific Gravity, Urine: 1.016 (ref 1.005–1.030)
pH: 6 (ref 5.0–8.0)

## 2017-12-05 MED ORDER — CYCLOBENZAPRINE HCL 10 MG PO TABS
10.0000 mg | ORAL_TABLET | Freq: Once | ORAL | Status: DC
  Filled 2017-12-05: qty 1

## 2017-12-05 MED ORDER — CYCLOBENZAPRINE HCL 10 MG PO TABS: 10.0000 mg | ORAL_TABLET | Freq: Two times a day (BID) | ORAL | 0 refills | Status: DC | PRN

## 2017-12-05 NOTE — MAU Provider Note (Addendum)
History     CSN: [id number]  Arrival date and time: 12/05/17 1325   First Provider Initiated Contact with Patient 12/05/17 1430      Chief Complaint  Patient presents with  . Abdominal Pain  . Decreased Fetal Movement  . Vaginal Pain   HPI   Ms.Teresa Glenn is a 37 y.o. female [id number] @ 107w0d here in MAU with pressure in her bottom and lower abdomen. The pressure started 4 days ago. The pain is not all the time, the pressure does not interfere with her sleep. The pressure is there more when she is up moving around. Say she can go several minutes without feeling the pressure. No vaginal bleeding or leaking of fluid. Says she is on her feet a lot for work.  + fetal movement at this time.   OB History    Gravida  6   Para  2   Term  2   Preterm  0   AB  3   Living  2     SAB  2   TAB  1   Ectopic  0   Multiple  0   Live Births  2           Past Medical History:  Diagnosis Date  . Ovarian cyst   . UTI (urinary tract infection)     Past Surgical History:  Procedure Laterality Date  . DILATION AND CURETTAGE OF UTERUS      Family History  Problem Relation Age of Onset  . Stroke Mother   . Diabetes Father   . Alzheimer's disease Maternal Grandmother   . Alzheimer's disease Paternal Grandmother     Social History   Tobacco Use  . Smoking status: Never Smoker  . Smokeless tobacco: Never Used  Substance Use Topics  . Alcohol use: No    Comment: occasional, prior to preg  . Drug use: No    Allergies: No Known Allergies  Medications Prior to Admission  Medication Sig Dispense Refill Last Dose  . acetaminophen (TYLENOL) 500 MG tablet Take 500 mg by mouth every 6 (six) hours as needed for mild pain.    Past Week at Unknown time  . diphenhydrAMINE (BENADRYL) 12.5 MG/5ML elixir Take 25 mg by mouth daily as needed for allergies.   Past Week at Unknown time  . ferrous sulfate 325 (65 FE) MG tablet Take 1 tablet (325 mg total) by mouth 2  (two) times daily with a meal. (Patient taking differently: Take 325 mg by mouth daily with breakfast. ) 60 tablet 2 Past Week at Unknown time  . Prenatal Vit-Fe Fumarate-FA (PRENATAL MULTIVITAMIN) TABS tablet Take 1 tablet by mouth daily at 12 noon.   12/05/2017 at Unknown time  . nitrofurantoin, macrocrystal-monohydrate, (MACROBID) 100 MG capsule Take 1 capsule (100 mg total) by mouth 2 (two) times daily. (Patient not taking: Reported on 11/17/2017) 14 capsule 0 Completed Course at Unknown time  . terconazole (TERAZOL 3) 0.8 % vaginal cream Place 1 applicator vaginally at bedtime. (Patient not taking: Reported on 11/17/2017) 20 g 0 Completed Course at Unknown time    Results for orders placed or performed during the hospital encounter of 12/05/17 (from the past 48 hour(s))  Urinalysis, Routine w reflex microscopic     Status: Abnormal   Collection Time: 12/05/17  1:40 PM  Result Value Ref Range   Color, Urine YELLOW YELLOW   APPearance HAZY (A) CLEAR   Specific Gravity, Urine 1.016 1.005 - 1.030  pH 6.0 5.0 - 8.0   Glucose, UA NEGATIVE NEGATIVE mg/dL   Hgb urine dipstick NEGATIVE NEGATIVE   Bilirubin Urine NEGATIVE NEGATIVE   Ketones, ur NEGATIVE NEGATIVE mg/dL   Protein, ur NEGATIVE NEGATIVE mg/dL   Nitrite NEGATIVE NEGATIVE   Leukocytes, UA MODERATE (A) NEGATIVE   RBC / HPF 0-5 0 - 5 RBC/hpf   WBC, UA 11-20 0 - 5 WBC/hpf   Bacteria, UA FEW (A) NONE SEEN   Squamous Epithelial / LPF 6-10 0 - 5   Mucus PRESENT     Comment: Performed at [facility], [address] [place], [place] [address]   Review of Systems  Gastrointestinal: Negative for abdominal pain, diarrhea and nausea.  Genitourinary: Negative for dysuria, flank pain, frequency and urgency.  Musculoskeletal: Positive for back pain (Not a new symptom).   Physical Exam   Blood pressure 100/60, pulse 79, temperature 98.4 F (36.9 C), temperature source Oral, resp. rate 16, height  (1.651 m), weight 159 lb (72.1 kg),  last menstrual period 05/02/2017, SpO2 100 %.  Physical Exam  Constitutional: She is oriented to person, place, and time. She appears well-developed and well-nourished. No distress.  HENT:  Head: Normocephalic.  Eyes: Pupils are equal, round, and reactive to light.  Neck: Neck supple.  GI: Soft. She exhibits no distension. There is no tenderness. There is no rebound and no guarding.  Genitourinary:  Genitourinary Comments: Dilation: Closed, thick, posterior  Exam by:: Payzlee Ryder NP  Musculoskeletal: Normal range of motion.  Neurological: She is alert and oriented to person, place, and time.  Skin: Skin is warm. She is not diaphoretic.  Psychiatric: Her behavior is normal.   Fetal Tracing: Baseline: 120 bpm Variability: Moderate  Accelerations: 15x15 Decelerations: None Toco: Quiet   MAU Course  Procedures  None  MDM  UA Flexeril offered to patient in MAU. Patient requests RX for home use.   Assessment and Plan   A:  1. Decreased fetal movement during pregnancy, antepartum, single or unspecified fetus   2. NST (non-stress test) reactive   3. Round ligament pain     P:  Discharge home in stable condition Rx: Flexeril Encouraged pregnancy support belt  Increase oral fluid intake. Return to MAU if symptoms worsen  Daundre Biel, Harolyn Rutherford, NP 12/07/2017 12:46 PM

## 2017-12-05 NOTE — MAU Note (Signed)
Pt reports pressure and pain in her vaginal area off and on for a few days. Reports positive fetal movement but it is less than normal .

## 2017-12-05 NOTE — Discharge Instructions (Signed)
Fetal Movement Counts Patient Name: ________________________________________________ Patient Due Date: ____________________ What is a fetal movement count? A fetal movement count is the number of times that you feel your baby move during a certain amount of time. This may also be called a fetal kick count. A fetal movement count is recommended for every pregnant woman. You may be asked to start counting fetal movements as early as week 28 of your pregnancy. Pay attention to when your baby is most active. You may notice your baby's sleep and wake cycles. You may also notice things that make your baby move more. You should do a fetal movement count:  When your baby is normally most active.  At the same time each day.  A good time to count movements is while you are resting, after having something to eat and drink. How do I count fetal movements? 1. Find a quiet, comfortable area. Sit, or lie down on your side. 2. Write down the date, the start time and stop time, and the number of movements that you felt between those two times. Take this information with you to your health care visits. 3. For 2 hours, count kicks, flutters, swishes, rolls, and jabs. You should feel at least 10 movements during 2 hours. 4. You may stop counting after you have felt 10 movements. 5. If you do not feel 10 movements in 2 hours, have something to eat and drink. Then, keep resting and counting for 1 hour. If you feel at least 4 movements during that hour, you may stop counting. Contact a health care provider if:  You feel fewer than 4 movements in 2 hours.  Your baby is not moving like he or she usually does. Date: ____________ Start time: ____________ Stop time: ____________ Movements: ____________ Date: ____________ Start time: ____________ Stop time: ____________ Movements: ____________ Date: ____________ Start time: ____________ Stop time: ____________ Movements: ____________ Date: ____________ Start time:  ____________ Stop time: ____________ Movements: ____________ Date: ____________ Start time: ____________ Stop time: ____________ Movements: ____________ Date: ____________ Start time: ____________ Stop time: ____________ Movements: ____________ Date: ____________ Start time: ____________ Stop time: ____________ Movements: ____________ Date: ____________ Start time: ____________ Stop time: ____________ Movements: ____________ Date: ____________ Start time: ____________ Stop time: ____________ Movements: ____________ This information is not intended to replace advice given to you by your health care provider. Make sure you discuss any questions you have with your health care provider. Document Released: 08/11/2006 Document Revised: 03/10/2016 Document Reviewed: 08/21/2015 Elsevier Interactive Patient Education  2018 [employer] Round Ligament Pain During Pregnancy   Round ligament pain is a sharp pain or jabbing feeling often felt in the lower belly or groin area on one or both sides. It is one of the most common complaints during pregnancy and is considered a normal part of pregnancy. It is most often felt during the second trimester.   Here is what you need to know about round ligament pain, including some tips to help you feel better.   Causes of Round Ligament Pain:    Several thick ligaments surround and support your womb (uterus) as it grows during pregnancy. One of them is called the round ligament.   The round ligament connects the front part of the womb to your groin, the area where your legs attach to your pelvis. The round ligament normally tightens and relaxes slowly.   As your baby and womb grow, the round ligament stretches. That makes it more likely to become strained.   Sudden movements can  cause the ligament to tighten quickly, like a rubber band snapping. This causes a sudden and quick jabbing feeling.   Symptoms of Round Ligament Pain   Round ligament pain can be  concerning and uncomfortable. But it is considered normal as your body changes during pregnancy.   The symptoms of round ligament pain include a sharp, sudden spasm in the belly. It usually affects the right side, but it may happen on both sides. The pain only lasts a few seconds.   Exercise may cause the pain, as will rapid movements such as:   sneezing  coughing  laughing  rolling over in bed  standing up too quickly   Treatment of Round Ligament Pain   Here are some tips that may help reduce your discomfort:   Pain relief. Take over-the-counter acetaminophen for pain, if necessary. Ask your doctor if this is OK.   Exercise. Get plenty of exercise to keep your stomach (core) muscles strong. Doing stretching exercises or prenatal yoga can be helpful. Ask your doctor which exercises are safe for you and your baby.   A helpful exercise involves putting your hands and knees on the floor, lowering your head, and pushing your backside into the air.   Avoid sudden movements. Change positions slowly (such as standing up or sitting down) to avoid sudden movements that may cause stretching and pain.   Flex your hips. Bend and flex your hips before you cough, sneeze, or laugh to avoid pulling on the ligaments.   Apply warmth. A heating pad or warm bath may be helpful. Ask your doctor if this is OK. Extreme heat can be dangerous to the baby.   You should try to modify your daily activity level and avoid positions that may worsen the condition.   When to Call the Doctor/Midwife   Always tell your doctor or midwife about any type of pain you have during pregnancy. Round ligament pain is quick and doesn't last long.   Call your health care provider immediately if you have:   severe pain  fever  chills  pain on urination  difficulty walking   Belly pain during pregnancy can be due to many different causes. It is important for your doctor to rule out more serious conditions, including  pregnancy complications such as placenta abruption or non-pregnancy illnesses such as:   inguinal hernia  appendicitis  stomach, liver, and kidney problems  Preterm labor pains may sometimes be mistaken for round ligament pain.         l

## 2017-12-24 ENCOUNTER — Inpatient Hospital Stay (HOSPITAL_COMMUNITY)
Admission: AD | Admit: 2017-12-24 | Discharge: 2017-12-25 | Disposition: A | Discharge status: Home / Self Care | Payer: BLUE CROSS/BLUE SHIELD | Source: Ambulatory Visit | Attending: Obstetrics and Gynecology | Admitting: Obstetrics and Gynecology

## 2017-12-24 ENCOUNTER — Encounter (HOSPITAL_COMMUNITY): Payer: Self-pay

## 2017-12-24 DIAGNOSIS — N949 Unspecified condition associated with female genital organs and menstrual cycle: Secondary | ICD-10-CM | POA: Diagnosis not present

## 2017-12-24 DIAGNOSIS — Z3A33 33 weeks gestation of pregnancy: Secondary | ICD-10-CM | POA: Insufficient documentation

## 2017-12-24 DIAGNOSIS — O09523 Supervision of elderly multigravida, third trimester: Secondary | ICD-10-CM | POA: Insufficient documentation

## 2017-12-24 DIAGNOSIS — O26893 Other specified pregnancy related conditions, third trimester: Secondary | ICD-10-CM | POA: Diagnosis not present

## 2017-12-24 DIAGNOSIS — R102 Pelvic and perineal pain: Secondary | ICD-10-CM | POA: Diagnosis present

## 2017-12-24 DIAGNOSIS — O4703 False labor before 37 completed weeks of gestation, third trimester: Secondary | ICD-10-CM | POA: Diagnosis not present

## 2017-12-24 LAB — URINALYSIS, ROUTINE W REFLEX MICROSCOPIC
Bilirubin Urine: NEGATIVE
Glucose, UA: NEGATIVE mg/dL
HGB URINE DIPSTICK: NEGATIVE
KETONES UR: NEGATIVE mg/dL
Nitrite: NEGATIVE
PH: 6 (ref 5.0–8.0)
PROTEIN: NEGATIVE mg/dL
Specific Gravity, Urine: 1.006 (ref 1.005–1.030)

## 2017-12-24 MED ORDER — CYCLOBENZAPRINE HCL 10 MG PO TABS: 10.0000 mg | ORAL_TABLET | Freq: Three times a day (TID) | ORAL | 0 refills | Status: DC | PRN

## 2017-12-24 NOTE — Discharge Instructions (Signed)
Braxton Hicks Contractions °Contractions of the uterus can occur throughout pregnancy, but they are not always a sign that you are in labor. You may have practice contractions called Braxton Hicks contractions. These false labor contractions are sometimes confused with true labor. °What are Braxton Hicks contractions? °Braxton Hicks contractions are tightening movements that occur in the muscles of the uterus before labor. Unlike true labor contractions, these contractions do not result in opening (dilation) and thinning of the cervix. Toward the end of pregnancy (32-34 weeks), Braxton Hicks contractions can happen more often and may become stronger. These contractions are sometimes difficult to tell apart from true labor because they can be very uncomfortable. You should not feel embarrassed if you go to the hospital with false labor. °Sometimes, the only way to tell if you are in true labor is for your health care provider to look for changes in the cervix. The health care provider will do a physical exam and may monitor your contractions. If you are not in true labor, the exam should show that your cervix is not dilating and your water has not broken. °If there are other health problems associated with your pregnancy, it is completely safe for you to be sent home with false labor. You may continue to have Braxton Hicks contractions until you go into true labor. °How to tell the difference between true labor and false labor °True labor °· Contractions last 30-70 seconds. °· Contractions become very regular. °· Discomfort is usually felt in the top of the uterus, and it spreads to the lower abdomen and low back. °· Contractions do not go away with walking. °· Contractions usually become more intense and increase in frequency. °· The cervix dilates and gets thinner. °False labor °· Contractions are usually shorter and not as strong as true labor contractions. °· Contractions are usually irregular. °· Contractions  are often felt in the front of the lower abdomen and in the groin. °· Contractions may go away when you walk around or change positions while lying down. °· Contractions get weaker and are shorter-lasting as time goes on. °· The cervix usually does not dilate or become thin. °Follow these instructions at home: °· Take over-the-counter and prescription medicines only as told by your health care provider. °· Keep up with your usual exercises and follow other instructions from your health care provider. °· Eat and drink lightly if you think you are going into labor. °· If Braxton Hicks contractions are making you uncomfortable: °? Change your position from lying down or resting to walking, or change from walking to resting. °? Sit and rest in a tub of warm water. °? Drink enough fluid to keep your urine pale yellow. Dehydration may cause these contractions. °? Do slow and deep breathing several times an hour. °· Keep all follow-up prenatal visits as told by your health care provider. This is important. °Contact a health care provider if: °· You have a fever. °· You have continuous pain in your abdomen. °Get help right away if: °· Your contractions become stronger, more regular, and closer together. °· You have fluid leaking or gushing from your vagina. °· You pass blood-tinged mucus (bloody show). °· You have bleeding from your vagina. °· You have low back pain that you never had before. °· You feel your baby’s head pushing down and causing pelvic pressure. °· Your baby is not moving inside you as much as it used to. °Summary °· Contractions that occur before labor are called Braxton   Hicks contractions, false labor, or practice contractions. °· Braxton Hicks contractions are usually shorter, weaker, farther apart, and less regular than true labor contractions. True labor contractions usually become progressively stronger and regular and they become more frequent. °· Manage discomfort from Braxton Hicks contractions by  changing position, resting in a warm bath, drinking plenty of water, or practicing deep breathing. °This information is not intended to replace advice given to you by your health care provider. Make sure you discuss any questions you have with your health care provider. °Document Released: 11/25/2016 Document Revised: 11/25/2016 Document Reviewed: 11/25/2016 °Elsevier Interactive Patient Education © 2018 [employer] ° °

## 2017-12-24 NOTE — MAU Note (Addendum)
Having a lot of pelvic pressure and back pain. I know I will have some but this seems more than usual. Denies bleeding or LOF. Tried pregnancy belt but is uncomfortable. PRessure much worse with walking

## 2017-12-24 NOTE — MAU Provider Note (Signed)
CC:  Chief Complaint  Patient presents with  . Pelvic Pain  . Back Pain     First Provider Initiated Contact with Patient 12/24/17 2147      HPI: Evette DoffingKarimah S Abdul-Hameed is a 37 y.o. year old [id number] female at 7112w5d weeks gestation who presents to MAU reporting contractions, pelvic pressure and back pain that is worsened over the last 3 days.  Unable to describe frequency of contractions.  States her discomforts are worse than what she experienced at this point in her other pregnancies.  Associated Sx: Negative for fever, chills, nausea, vomiting, diarrhea, constipation, urinary complaints, vaginal discharge.   Aggravating alleviating factors: Aggravated by prolonged standing and lifting at work.  Some improvement with Flexeril, but patient has not used it much due to sedation. Vaginal bleeding: Denies Leaking of fluid: Denies Fetal movement: Very active  O:  Patient Vitals for the past 24 hrs:  BP Temp Pulse Resp Height Weight  12/24/17 2131 109/73 97.9 F (36.6 C) 82 18 5\' 5"  (1.651 m) 159 lb (72.1 kg)    General: NAD Heart: Regular rate Lungs: Normal rate and effort Abd: Soft, NT, Gravid, S=D Pelvic: NEFG, negative LOF, negative blood.  Dilation: Fingertip Effacement (%): Thick Exam by:: Ivonne AndrewV Grantham Hippert CNM  EFM: 135, Moderate variability, 15 x 15 accelerations, no decelerations Toco: Uterine irritability  Orders Placed This Encounter  Procedures  . Urinalysis, Routine w reflex microscopic  . Discharge patient   Meds ordered this encounter  Medications  . cyclobenzaprine (FLEXERIL) 10 MG tablet    Sig: Take 1 tablet (10 mg total) by mouth every 8 (eight) hours as needed for muscle spasms.    Dispense:  20 tablet    Refill:  0    Order Specific Question:   Supervising Provider    Answer:   CONSTANT, PEGGY [[id number]]    MDM -Low abdomen low back pain likely a combination of preterm contractions, round ligament pain.  No evidence of active preterm labor, UTI or emergent  condition.  Discussed comfort measures.  Offered Ambien to help with sleep, but patient declines.  Will try using Flexeril that she was given at last visit and try sitting more at work.  A: 5312w5d week IUP Preterm contractions Round ligament pain FHR reactive  P: Discharge home in stable condition. Labor/Preterm labor precautions and fetal kick counts. Follow-up as scheduled for prenatal visit or sooner as needed if symptoms worsen. Return to maternity admissions as needed if symptoms worsen.  Katrinka BlazingSmith, [place], CNM 12/24/2017 11:52 PM  3

## 2018-01-05 LAB — OB RESULTS CONSOLE GBS
GBS: NEGATIVE
STREP GROUP B AG: NEGATIVE

## 2018-01-05 LAB — OB RESULTS CONSOLE GC/CHLAMYDIA
Chlamydia: NEGATIVE
Gonorrhea: NEGATIVE

## 2018-01-18 ENCOUNTER — Encounter (HOSPITAL_COMMUNITY): Payer: Self-pay | Admitting: *Deleted

## 2018-01-18 ENCOUNTER — Inpatient Hospital Stay (HOSPITAL_COMMUNITY)
Admission: AD | Admit: 2018-01-18 | Discharge: 2018-01-18 | Disposition: A | Discharge status: Home / Self Care | Payer: BLUE CROSS/BLUE SHIELD | Source: Ambulatory Visit | Attending: Obstetrics and Gynecology | Admitting: Obstetrics and Gynecology

## 2018-01-18 DIAGNOSIS — O26893 Other specified pregnancy related conditions, third trimester: Secondary | ICD-10-CM | POA: Diagnosis present

## 2018-01-18 DIAGNOSIS — Z3A39 39 weeks gestation of pregnancy: Secondary | ICD-10-CM | POA: Insufficient documentation

## 2018-01-18 DIAGNOSIS — O479 False labor, unspecified: Secondary | ICD-10-CM

## 2018-01-18 LAB — URINALYSIS, ROUTINE W REFLEX MICROSCOPIC
Bilirubin Urine: NEGATIVE
Glucose, UA: NEGATIVE mg/dL
Hgb urine dipstick: NEGATIVE
Ketones, ur: NEGATIVE mg/dL
LEUKOCYTES UA: NEGATIVE
NITRITE: NEGATIVE
PH: 5 (ref 5.0–8.0)
Protein, ur: NEGATIVE mg/dL
SPECIFIC GRAVITY, URINE: 1.013 (ref 1.005–1.030)

## 2018-01-18 NOTE — Discharge Instructions (Signed)
Braxton Hicks Contractions °Contractions of the uterus can occur throughout pregnancy, but they are not always a sign that you are in labor. You may have practice contractions called Braxton Hicks contractions. These false labor contractions are sometimes confused with true labor. °What are Braxton Hicks contractions? °Braxton Hicks contractions are tightening movements that occur in the muscles of the uterus before labor. Unlike true labor contractions, these contractions do not result in opening (dilation) and thinning of the cervix. Toward the end of pregnancy (32-34 weeks), Braxton Hicks contractions can happen more often and may become stronger. These contractions are sometimes difficult to tell apart from true labor because they can be very uncomfortable. You should not feel embarrassed if you go to the hospital with false labor. °Sometimes, the only way to tell if you are in true labor is for your health care provider to look for changes in the cervix. The health care provider will do a physical exam and may monitor your contractions. If you are not in true labor, the exam should show that your cervix is not dilating and your water has not broken. °If there are other health problems associated with your pregnancy, it is completely safe for you to be sent home with false labor. You may continue to have Braxton Hicks contractions until you go into true labor. °How to tell the difference between true labor and false labor °True labor °· Contractions last 30-70 seconds. °· Contractions become very regular. °· Discomfort is usually felt in the top of the uterus, and it spreads to the lower abdomen and low back. °· Contractions do not go away with walking. °· Contractions usually become more intense and increase in frequency. °· The cervix dilates and gets thinner. °False labor °· Contractions are usually shorter and not as strong as true labor contractions. °· Contractions are usually irregular. °· Contractions  are often felt in the front of the lower abdomen and in the groin. °· Contractions may go away when you walk around or change positions while lying down. °· Contractions get weaker and are shorter-lasting as time goes on. °· The cervix usually does not dilate or become thin. °Follow these instructions at home: °· Take over-the-counter and prescription medicines only as told by your health care provider. °· Keep up with your usual exercises and follow other instructions from your health care provider. °· Eat and drink lightly if you think you are going into labor. °· If Braxton Hicks contractions are making you uncomfortable: °? Change your position from lying down or resting to walking, or change from walking to resting. °? Sit and rest in a tub of warm water. °? Drink enough fluid to keep your urine pale yellow. Dehydration may cause these contractions. °? Do slow and deep breathing several times an hour. °· Keep all follow-up prenatal visits as told by your health care provider. This is important. °Contact a health care provider if: °· You have a fever. °· You have continuous pain in your abdomen. °Get help right away if: °· Your contractions become stronger, more regular, and closer together. °· You have fluid leaking or gushing from your vagina. °· You have bleeding from your vagina. °· You have low back pain that you never had before. °· You feel your baby’s head pushing down and causing pelvic pressure. °· Your baby is not moving inside you as much as it used to. °Summary °· Contractions that occur before labor are called Braxton Hicks contractions, false labor, or practice contractions. °·   Braxton Hicks contractions are usually shorter, weaker, farther apart, and less regular than true labor contractions. True labor contractions usually become progressively stronger and regular and they become more frequent. °· Manage discomfort from Braxton Hicks contractions by changing position, resting in a warm bath,  drinking plenty of water, or practicing deep breathing. °This information is not intended to replace advice given to you by your health care provider. Make sure you discuss any questions you have with your health care provider. °Document Released: 11/25/2016 Document Revised: 11/25/2016 Document Reviewed: 11/25/2016 °Elsevier Interactive Patient Education © 2018 [employer] ° °

## 2018-01-18 NOTE — MAU Note (Signed)
I have communicated with Dr. Janee Mornhompson and reviewed vital signs:  Vitals:   01/18/18 0912  BP: 117/76  Pulse: 99  Resp: 18  Temp: 97.6 F (36.4 C)    Vaginal exam:  Dilation: 2 Effacement (%): Thick Cervical Position: Posterior Station: -3 Exam by:: Dorrene GermanJ. Gerilynn Mccullars RN,   Also reviewed contraction pattern and that non-stress test is reactive.  It has been documented that patient is contracting every 3-10 minutes, SVE 2/thick/posterior, not indicating active labor.  Patient denies any other complaints.  Based on this report provider has given order for discharge.  A discharge order and diagnosis entered by a provider.   Labor discharge instructions reviewed with patient.

## 2018-01-18 NOTE — MAU Note (Signed)
Pt states uc's started @ 0445, feeling pelvic pressure, uc's are not as strong when sitting down.  Denies bleeding or LOF.  Reports active fetal movement.

## 2018-02-03 ENCOUNTER — Encounter (HOSPITAL_COMMUNITY): Payer: Self-pay

## 2018-02-03 ENCOUNTER — Other Ambulatory Visit: Payer: Self-pay

## 2018-02-03 ENCOUNTER — Inpatient Hospital Stay (HOSPITAL_COMMUNITY)
Admission: AD | Admit: 2018-02-03 | Discharge: 2018-02-03 | Disposition: A | Discharge status: Home / Self Care | Payer: BLUE CROSS/BLUE SHIELD | Source: Ambulatory Visit | Attending: Obstetrics and Gynecology | Admitting: Obstetrics and Gynecology

## 2018-02-03 DIAGNOSIS — Z3A39 39 weeks gestation of pregnancy: Secondary | ICD-10-CM | POA: Insufficient documentation

## 2018-02-03 DIAGNOSIS — O471 False labor at or after 37 completed weeks of gestation: Secondary | ICD-10-CM

## 2018-02-03 NOTE — MAU Note (Signed)
Been having contractions since about 0700. q 5 or less. No bleeding or leaking.  Baby moving now. 2cm when last checked.

## 2018-02-03 NOTE — Discharge Instructions (Signed)
Braxton Hicks Contractions °Contractions of the uterus can occur throughout pregnancy, but they are not always a sign that you are in labor. You may have practice contractions called Braxton Hicks contractions. These false labor contractions are sometimes confused with true labor. °What are Braxton Hicks contractions? °Braxton Hicks contractions are tightening movements that occur in the muscles of the uterus before labor. Unlike true labor contractions, these contractions do not result in opening (dilation) and thinning of the cervix. Toward the end of pregnancy (32-34 weeks), Braxton Hicks contractions can happen more often and may become stronger. These contractions are sometimes difficult to tell apart from true labor because they can be very uncomfortable. You should not feel embarrassed if you go to the hospital with false labor. °Sometimes, the only way to tell if you are in true labor is for your health care provider to look for changes in the cervix. The health care provider will do a physical exam and may monitor your contractions. If you are not in true labor, the exam should show that your cervix is not dilating and your water has not broken. °If there are other health problems associated with your pregnancy, it is completely safe for you to be sent home with false labor. You may continue to have Braxton Hicks contractions until you go into true labor. °How to tell the difference between true labor and false labor °True labor °· Contractions last 30-70 seconds. °· Contractions become very regular. °· Discomfort is usually felt in the top of the uterus, and it spreads to the lower abdomen and low back. °· Contractions do not go away with walking. °· Contractions usually become more intense and increase in frequency. °· The cervix dilates and gets thinner. °False labor °· Contractions are usually shorter and not as strong as true labor contractions. °· Contractions are usually irregular. °· Contractions  are often felt in the front of the lower abdomen and in the groin. °· Contractions may go away when you walk around or change positions while lying down. °· Contractions get weaker and are shorter-lasting as time goes on. °· The cervix usually does not dilate or become thin. °Follow these instructions at home: °· Take over-the-counter and prescription medicines only as told by your health care provider. °· Keep up with your usual exercises and follow other instructions from your health care provider. °· Eat and drink lightly if you think you are going into labor. °· If Braxton Hicks contractions are making you uncomfortable: °? Change your position from lying down or resting to walking, or change from walking to resting. °? Sit and rest in a tub of warm water. °? Drink enough fluid to keep your urine pale yellow. Dehydration may cause these contractions. °? Do slow and deep breathing several times an hour. °· Keep all follow-up prenatal visits as told by your health care provider. This is important. °Contact a health care provider if: °· You have a fever. °· You have continuous pain in your abdomen. °Get help right away if: °· Your contractions become stronger, more regular, and closer together. °· You have fluid leaking or gushing from your vagina. °· You pass blood-tinged mucus (bloody show). °· You have bleeding from your vagina. °· You have low back pain that you never had before. °· You feel your baby’s head pushing down and causing pelvic pressure. °· Your baby is not moving inside you as much as it used to. °Summary °· Contractions that occur before labor are called Braxton   Hicks contractions, false labor, or practice contractions. °· Braxton Hicks contractions are usually shorter, weaker, farther apart, and less regular than true labor contractions. True labor contractions usually become progressively stronger and regular and they become more frequent. °· Manage discomfort from Braxton Hicks contractions by  changing position, resting in a warm bath, drinking plenty of water, or practicing deep breathing. °This information is not intended to replace advice given to you by your health care provider. Make sure you discuss any questions you have with your health care provider. °Document Released: 11/25/2016 Document Revised: 11/25/2016 Document Reviewed: 11/25/2016 °Elsevier Interactive Patient Education © 2018 Elsevier Inc. ° °Third Trimester of Pregnancy °The third trimester is from week 29 through week 42, months 7 through 9. This trimester is when your unborn baby (fetus) is growing very fast. At the end of the ninth month, the unborn baby is about 20 inches in length. It weighs about 6-10 pounds. °Follow these instructions at home: °· Avoid all smoking, herbs, and alcohol. Avoid drugs not approved by your doctor. °· Do not use any tobacco products, including cigarettes, chewing tobacco, and electronic cigarettes. If you need help quitting, ask your doctor. You may get counseling or other support to help you quit. °· Only take medicine as told by your doctor. Some medicines are safe and some are not during pregnancy. °· Exercise only as told by your doctor. Stop exercising if you start having cramps. °· Eat regular, healthy meals. °· Wear a good support bra if your breasts are tender. °· Do not use hot tubs, steam rooms, or saunas. °· Wear your seat belt when driving. °· Avoid raw meat, uncooked cheese, and liter boxes and soil used by cats. °· Take your prenatal vitamins. °· Take 1500-2000 milligrams of calcium daily starting at the 20th week of pregnancy until you deliver your baby. °· Try taking medicine that helps you poop (stool softener) as needed, and if your doctor approves. Eat more fiber by eating fresh fruit, vegetables, and whole grains. Drink enough fluids to keep your pee (urine) clear or pale yellow. °· Take warm water baths (sitz baths) to soothe pain or discomfort caused by hemorrhoids. Use hemorrhoid  cream if your doctor approves. °· If you have puffy, bulging veins (varicose veins), wear support hose. Raise (elevate) your feet for 15 minutes, 3-4 times a day. Limit salt in your diet. °· Avoid heavy lifting, wear low heels, and sit up straight. °· Rest with your legs raised if you have leg cramps or low back pain. °· Visit your dentist if you have not gone during your pregnancy. Use a soft toothbrush to brush your teeth. Be gentle when you floss. °· You can have sex (intercourse) unless your doctor tells you not to. °· Do not travel far distances unless you must. Only do so with your doctor's approval. °· Take prenatal classes. °· Practice driving to the hospital. °· Pack your hospital bag. °· Prepare the baby's room. °· Go to your doctor visits. °Get help if: °· You are not sure if you are in labor or if your water has broken. °· You are dizzy. °· You have mild cramps or pressure in your lower belly (abdominal). °· You have a nagging pain in your belly area. °· You continue to feel sick to your stomach (nauseous), throw up (vomit), or have watery poop (diarrhea). °· You have bad smelling fluid coming from your vagina. °· You have pain with peeing (urination). °Get help right away if: °·   You have a fever. °· You are leaking fluid from your vagina. °· You are spotting or bleeding from your vagina. °· You have severe belly cramping or pain. °· You lose or gain weight rapidly. °· You have trouble catching your breath and have chest pain. °· You notice sudden or extreme puffiness (swelling) of your face, hands, ankles, feet, or legs. °· You have not felt the baby move in over an hour. °· You have severe headaches that do not go away with medicine. °· You have vision changes. °This information is not intended to replace advice given to you by your health care provider. Make sure you discuss any questions you have with your health care provider. °Document Released: 10/06/2009 Document Revised: 12/18/2015 Document  Reviewed: 09/12/2012 °Elsevier Interactive Patient Education © 2017 Elsevier Inc. ° °

## 2018-02-03 NOTE — MAU Note (Signed)
[facility]/ Raelyn Moraolitta Dawson CNM and reviewed vital signs:  Vitals:   02/03/18 1118 02/03/18 1415  BP: 110/71 112/74  Pulse: (!) 105 85  Resp: 17 20  Temp: 98.1 F (36.7 C)   SpO2: 100%     Vaginal exam:  Dilation: 3 Effacement (%): 50 Cervical Position: Posterior Station: Ballotable Exam by:: Jolynn Spurlock-Frizzell,   Also reviewed contraction pattern and that non-stress test is reactive.  It has been documented that patient is contracting every 2-5 minutes with no cervical change over 1.5 hours not indicating active labor.  Patient denies any other complaints.  Based on this report provider has given order for discharge.  A discharge order and diagnosis entered by a provider.   Labor discharge instructions reviewed with patient.

## 2018-02-06 ENCOUNTER — Encounter (HOSPITAL_COMMUNITY): Payer: Self-pay | Admitting: *Deleted

## 2018-02-06 ENCOUNTER — Telehealth (HOSPITAL_COMMUNITY): Payer: Self-pay | Admitting: *Deleted

## 2018-02-06 NOTE — Telephone Encounter (Signed)
Preadmission screen  

## 2018-02-13 ENCOUNTER — Inpatient Hospital Stay (HOSPITAL_COMMUNITY)
Admission: RE | Admit: 2018-02-13 | Discharge: 2018-02-16 | DRG: 807 | Disposition: A | Discharge status: Home / Self Care | Payer: BLUE CROSS/BLUE SHIELD | Attending: Obstetrics and Gynecology | Admitting: Obstetrics and Gynecology

## 2018-02-13 DIAGNOSIS — O9902 Anemia complicating childbirth: Secondary | ICD-10-CM | POA: Diagnosis present

## 2018-02-13 DIAGNOSIS — O3663X0 Maternal care for excessive fetal growth, third trimester, not applicable or unspecified: Secondary | ICD-10-CM | POA: Diagnosis present

## 2018-02-13 DIAGNOSIS — O480 Post-term pregnancy: Secondary | ICD-10-CM | POA: Diagnosis present

## 2018-02-13 DIAGNOSIS — D649 Anemia, unspecified: Secondary | ICD-10-CM | POA: Diagnosis present

## 2018-02-13 DIAGNOSIS — Z3A41 41 weeks gestation of pregnancy: Secondary | ICD-10-CM

## 2018-02-13 LAB — CBC
HCT: 29.6 % — ABNORMAL LOW (ref 36.0–46.0)
Hemoglobin: 8.9 g/dL — ABNORMAL LOW (ref 12.0–15.0)
MCH: 21.4 pg — AB (ref 26.0–34.0)
MCHC: 30.1 g/dL (ref 30.0–36.0)
MCV: 71.3 fL — ABNORMAL LOW (ref 78.0–100.0)
PLATELETS: 221 10*3/uL (ref 150–400)
RBC: 4.15 MIL/uL (ref 3.87–5.11)
RDW: 19.1 % — AB (ref 11.5–15.5)
WBC: 10.6 10*3/uL — AB (ref 4.0–10.5)

## 2018-02-13 LAB — RPR: RPR Ser Ql: NONREACTIVE

## 2018-02-13 LAB — TYPE AND SCREEN
ABO/RH(D): O POS
Antibody Screen: NEGATIVE

## 2018-02-13 MED ORDER — PHENYLEPHRINE 8 MG IN D5W 100 ML (0.08MG/ML) PREMIX OPTIME
INJECTION | INTRAVENOUS | Status: AC
  Filled 2018-02-13: qty 100

## 2018-02-13 MED ORDER — TERBUTALINE SULFATE 1 MG/ML IJ SOLN
0.2500 mg | Freq: Once | INTRAMUSCULAR | Status: DC | PRN
  Filled 2018-02-13: qty 1

## 2018-02-13 MED ORDER — OXYTOCIN BOLUS FROM INFUSION
500.0000 mL | Freq: Once | INTRAVENOUS | Status: AC
  Administered 2018-02-14: 500 mL via INTRAVENOUS

## 2018-02-13 MED ORDER — MISOPROSTOL 25 MCG QUARTER TABLET
25.0000 ug | ORAL_TABLET | ORAL | Status: DC
  Filled 2018-02-13 (×7): qty 1

## 2018-02-13 MED ORDER — OXYCODONE-ACETAMINOPHEN 5-325 MG PO TABS: 1.0000 | ORAL_TABLET | ORAL | Status: DC | PRN

## 2018-02-13 MED ORDER — OXYTOCIN 40 UNITS IN LACTATED RINGERS INFUSION - SIMPLE MED
2.5000 [IU]/h | INTRAVENOUS | Status: DC
  Filled 2018-02-13: qty 1000

## 2018-02-13 MED ORDER — OXYCODONE-ACETAMINOPHEN 5-325 MG PO TABS
2.0000 | ORAL_TABLET | ORAL | Status: DC | PRN
Start: 2018-02-13 — End: 2018-02-14

## 2018-02-13 MED ORDER — HYDROXYZINE HCL 50 MG PO TABS
50.0000 mg | ORAL_TABLET | Freq: Four times a day (QID) | ORAL | Status: DC | PRN
  Filled 2018-02-13: qty 1

## 2018-02-13 MED ORDER — DEXAMETHASONE SODIUM PHOSPHATE 10 MG/ML IJ SOLN
INTRAMUSCULAR | Status: AC
  Filled 2018-02-13: qty 1

## 2018-02-13 MED ORDER — FENTANYL CITRATE (PF) 100 MCG/2ML IJ SOLN
50.0000 ug | INTRAMUSCULAR | Status: DC | PRN
  Administered 2018-02-13 (×2): 100 ug via INTRAVENOUS
  Filled 2018-02-13 (×2): qty 2

## 2018-02-13 MED ORDER — FLEET ENEMA 7-19 GM/118ML RE ENEM: 1.0000 | ENEMA | RECTAL | Status: DC | PRN

## 2018-02-13 MED ORDER — ACETAMINOPHEN 325 MG PO TABS: 650.0000 mg | ORAL_TABLET | ORAL | Status: DC | PRN

## 2018-02-13 MED ORDER — LACTATED RINGERS IV SOLN
INTRAVENOUS | Status: DC
  Administered 2018-02-13 – 2018-02-14 (×2): via INTRAVENOUS

## 2018-02-13 MED ORDER — LIDOCAINE HCL (PF) 1 % IJ SOLN
30.0000 mL | INTRAMUSCULAR | Status: DC | PRN
  Filled 2018-02-13: qty 30

## 2018-02-13 MED ORDER — ONDANSETRON HCL 4 MG/2ML IJ SOLN: 4.0000 mg | Freq: Four times a day (QID) | INTRAMUSCULAR | Status: DC | PRN

## 2018-02-13 MED ORDER — SOD CITRATE-CITRIC ACID 500-334 MG/5ML PO SOLN: 30.0000 mL | ORAL | Status: DC | PRN

## 2018-02-13 MED ORDER — LACTATED RINGERS IV SOLN: 500.0000 mL | INTRAVENOUS | Status: DC | PRN

## 2018-02-13 MED ORDER — OXYTOCIN 40 UNITS IN LACTATED RINGERS INFUSION - SIMPLE MED
1.0000 m[IU]/min | INTRAVENOUS | Status: DC
  Administered 2018-02-13: 2 m[IU]/min via INTRAVENOUS

## 2018-02-13 MED ORDER — ONDANSETRON HCL 4 MG/2ML IJ SOLN
INTRAMUSCULAR | Status: AC
  Filled 2018-02-13: qty 2

## 2018-02-13 NOTE — Anesthesia Pain Management Evaluation Note (Signed)
  [facility] Pain Management Visit Note  Patient: Teresa Glenn, 37 y.o., female  "Hello I am a member of the anesthesia team at [facility]. We have an anesthesia team available at all times to provide care throughout the hospital, including epidural management and anesthesia for C-section. I don't know your plan for the delivery whether it a natural birth, water birth, IV sedation, nitrous supplementation, doula or epidural, but we want to meet your pain goals."   1.Was your pain managed to your expectations on prior hospitalizations?   Yes   2.What is your expectation for pain management during this hospitalization?     Epidural  3.How can we help you reach that goal? epidural  Record the patient's initial score and the patient's pain goal.   Pain: 2  Pain Goal: 4 [facility] wants you to be able to say your pain was always managed very well.  Teresa Glenn 02/13/2018

## 2018-02-13 NOTE — Progress Notes (Signed)
Patient ID: Teresa Glenn, female   DOB: September 26, 1980, 37 y.o.   MRN: [id number]  Checked in on patient.  Doing well.  No complaints. Currently 4cm.  On pit. Not currently on epidural but states she would like one.  Membranes intact.      FHT: 125 bpm.  Mod. Variability. Reactive.   BP 122/70   Pulse 72   Temp 98.7 F (37.1 C) (Oral)   Resp 16   Ht 5\' 5"  (1.651 m)   Wt 78 kg (172 lb)   LMP 05/02/2017   BMI 28.62 kg/m   Dilation: 4 Effacement (%): 60 Cervical Position: Posterior Station: -3 Presentation: Vertex Exam by:: Kris HartmannNicole Jones, RN

## 2018-02-13 NOTE — Progress Notes (Signed)
LABOR PROGRESS NOTE  Teresa Glenn is a 37 y.o. [id number] at 3468w0d  admitted for IOL for PD  Subjective: Patient reports feeling her contractions more, but still manageable w/o meds  Objective: BP 122/72   Pulse 77   Temp 98.7 F (37.1 C) (Oral)   Resp 16   Ht 5\' 5"  (1.651 m)   Wt 172 lb (78 kg)   LMP 05/02/2017   BMI 28.62 kg/m  or  Vitals:   02/13/18 1700 02/13/18 1730 02/13/18 1811 02/13/18 1830  BP: 122/70 122/72 126/74 122/72  Pulse: 72 75 76 77  Resp: 16 16 16 16   Temp:      TempSrc:      Weight:      Height:        Dilation: 4 Effacement (%): 50 Cervical Position: Posterior Station: Ballotable, -3 Presentation: Vertex Exam by:: Dr. Nira Retortegele FHT: baseline rate 135, moderate varibility, +acel, ? Occasional variable decel Toco: ctx q 3-4 min  Assessment / Plan: 37 y.o. [id number] at 6068w0d here for IOL for PD  Labor: on IV Pitocin. Continue to titrate. Fetal head ballotable, so unable to AROM Fetal Wellbeing:  Cat I Pain Control:  supportive care Anticipated MOD:  SVD  Frederik PearJulie P Degele, MD 02/13/2018, 6:41 PM
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
## 2018-02-13 NOTE — Progress Notes (Signed)
LABOR PROGRESS NOTE  Teresa Glenn is a 37 y.o. [id number] at 7153w0d  admitted for IOL for postdates   Subjective: Patient breathing through contractions- does not want epidural at this time.   Objective: BP (!) 116/97   Pulse 76   Temp 98.1 F (36.7 C) (Oral)   Resp 16   Ht 5\' 5"  (1.651 m)   Wt 172 lb (78 kg)   LMP 05/02/2017   BMI 28.62 kg/m  or  Vitals:   02/13/18 1830 02/13/18 1915 02/13/18 2115 02/13/18 2225  BP: 122/72 116/76 110/68 (!) 116/97  Pulse: 77 85 71 76  Resp: 16 17  16   Temp:  98.8 F (37.1 C)  98.1 F (36.7 C)  TempSrc:  Oral  Oral  Weight:      Height:        Dilation: 6 Effacement (%): 60 Cervical Position: Posterior Station: Ballotable Presentation: Vertex Exam by:: Minus Libertyhristy Leshowitz, RN FHT: baseline rate 120, moderate varibility, +acel, no decel Toco: 2-3/ moderate-strong by palpation   Labs: Lab Results  Component Value Date   WBC 10.6 (H) 02/13/2018   HGB 8.9 (L) 02/13/2018   HCT 29.6 (L) 02/13/2018   MCV 71.3 (L) 02/13/2018   PLT 221 02/13/2018    Patient Active Problem List   Diagnosis Date Noted  . Post-dates pregnancy 02/13/2018    Assessment / Plan: 37 y.o. [id number] at 6253w0d here for IOL for PD   Labor: Progressing well on pitocin, continue titration. Fetus ballotable at this time, possible AROM at next cervical exam in 2 hours  Fetal Wellbeing:  Cat I  Pain Control:  IV Fentanyl  Anticipated MOD:  SVD   Sharyon CableRogers, Analya Louissaint C, CNM 02/13/2018, 11:42 PM

## 2018-02-14 ENCOUNTER — Inpatient Hospital Stay (HOSPITAL_COMMUNITY): Payer: BLUE CROSS/BLUE SHIELD | Admitting: Anesthesiology

## 2018-02-14 ENCOUNTER — Encounter (HOSPITAL_COMMUNITY): Payer: Self-pay

## 2018-02-14 DIAGNOSIS — O480 Post-term pregnancy: Secondary | ICD-10-CM

## 2018-02-14 DIAGNOSIS — Z3A41 41 weeks gestation of pregnancy: Secondary | ICD-10-CM

## 2018-02-14 MED ORDER — PRENATAL MULTIVITAMIN CH: 1.0000 | ORAL_TABLET | Freq: Every day | ORAL | Status: DC

## 2018-02-14 MED ORDER — IBUPROFEN 600 MG PO TABS
600.0000 mg | ORAL_TABLET | Freq: Four times a day (QID) | ORAL | Status: DC
  Administered 2018-02-14 – 2018-02-16 (×8): 600 mg via ORAL
  Filled 2018-02-14 (×9): qty 1

## 2018-02-14 MED ORDER — SENNOSIDES-DOCUSATE SODIUM 8.6-50 MG PO TABS
2.0000 | ORAL_TABLET | ORAL | Status: DC
  Administered 2018-02-15 (×2): 2 via ORAL
  Filled 2018-02-14 (×2): qty 2

## 2018-02-14 MED ORDER — DIBUCAINE 1 % RE OINT: 1.0000 "application " | TOPICAL_OINTMENT | RECTAL | Status: DC | PRN

## 2018-02-14 MED ORDER — WITCH HAZEL-GLYCERIN EX PADS: 1.0000 "application " | MEDICATED_PAD | CUTANEOUS | Status: DC | PRN

## 2018-02-14 MED ORDER — SIMETHICONE 80 MG PO CHEW: 80.0000 mg | CHEWABLE_TABLET | ORAL | Status: DC | PRN

## 2018-02-14 MED ORDER — ONDANSETRON HCL 4 MG PO TABS: 4.0000 mg | ORAL_TABLET | ORAL | Status: DC | PRN

## 2018-02-14 MED ORDER — ACETAMINOPHEN 325 MG PO TABS: 650.0000 mg | ORAL_TABLET | ORAL | Status: DC | PRN

## 2018-02-14 MED ORDER — ONDANSETRON HCL 4 MG/2ML IJ SOLN: 4.0000 mg | INTRAMUSCULAR | Status: DC | PRN

## 2018-02-14 MED ORDER — FERROUS SULFATE 325 (65 FE) MG PO TABS
325.0000 mg | ORAL_TABLET | Freq: Two times a day (BID) | ORAL | Status: DC
  Administered 2018-02-14 – 2018-02-16 (×3): 325 mg via ORAL
  Filled 2018-02-14 (×4): qty 1

## 2018-02-14 MED ORDER — ZOLPIDEM TARTRATE 5 MG PO TABS: 5.0000 mg | ORAL_TABLET | Freq: Every evening | ORAL | Status: DC | PRN

## 2018-02-14 MED ORDER — PHENYLEPHRINE 40 MCG/ML (10ML) SYRINGE FOR IV PUSH (FOR BLOOD PRESSURE SUPPORT)
80.0000 ug | PREFILLED_SYRINGE | INTRAVENOUS | Status: AC | PRN
  Administered 2018-02-14 (×3): 80 ug via INTRAVENOUS

## 2018-02-14 MED ORDER — LIDOCAINE HCL (PF) 1 % IJ SOLN
INTRAMUSCULAR | Status: DC | PRN
  Administered 2018-02-14: 4 mL via EPIDURAL
  Administered 2018-02-14: 6 mL via EPIDURAL

## 2018-02-14 MED ORDER — COCONUT OIL OIL: 1.0000 "application " | TOPICAL_OIL | Status: DC | PRN

## 2018-02-14 MED ORDER — FENTANYL 2.5 MCG/ML BUPIVACAINE 1/10 % EPIDURAL INFUSION (WH - ANES)
INTRAMUSCULAR | Status: AC
  Filled 2018-02-14: qty 100

## 2018-02-14 MED ORDER — TETANUS-DIPHTH-ACELL PERTUSSIS 5-2.5-18.5 LF-MCG/0.5 IM SUSP: 0.5000 mL | Freq: Once | INTRAMUSCULAR | Status: DC

## 2018-02-14 MED ORDER — BENZOCAINE-MENTHOL 20-0.5 % EX AERO: 1.0000 "application " | INHALATION_SPRAY | CUTANEOUS | Status: DC | PRN

## 2018-02-14 MED ORDER — PRENATAL MULTIVITAMIN CH
1.0000 | ORAL_TABLET | Freq: Every day | ORAL | Status: DC
  Administered 2018-02-15 – 2018-02-16 (×2): 1 via ORAL
  Filled 2018-02-14 (×2): qty 1

## 2018-02-14 MED ORDER — WITCH HAZEL-GLYCERIN EX PADS
1.0000 | MEDICATED_PAD | CUTANEOUS | Status: DC | PRN
Start: 2018-02-14 — End: 2018-02-16

## 2018-02-14 MED ORDER — LACTATED RINGERS IV SOLN
500.0000 mL | Freq: Once | INTRAVENOUS | Status: AC
  Administered 2018-02-14: 500 mL via INTRAVENOUS

## 2018-02-14 MED ORDER — PHENYLEPHRINE 40 MCG/ML (10ML) SYRINGE FOR IV PUSH (FOR BLOOD PRESSURE SUPPORT)
PREFILLED_SYRINGE | INTRAVENOUS | Status: AC
  Administered 2018-02-14: 80 ug via INTRAVENOUS
  Filled 2018-02-14: qty 20

## 2018-02-14 MED ORDER — DIPHENHYDRAMINE HCL 50 MG/ML IJ SOLN
12.5000 mg | INTRAMUSCULAR | Status: DC | PRN
Start: 2018-02-14 — End: 2018-02-14

## 2018-02-14 MED ORDER — DIPHENHYDRAMINE HCL 25 MG PO CAPS: 25.0000 mg | ORAL_CAPSULE | Freq: Four times a day (QID) | ORAL | Status: DC | PRN

## 2018-02-14 MED ORDER — FENTANYL 2.5 MCG/ML BUPIVACAINE 1/10 % EPIDURAL INFUSION (WH - ANES)
14.0000 mL/h | INTRAMUSCULAR | Status: DC | PRN
  Administered 2018-02-14: 14 mL/h via EPIDURAL

## 2018-02-14 MED ORDER — SENNOSIDES-DOCUSATE SODIUM 8.6-50 MG PO TABS: 2.0000 | ORAL_TABLET | ORAL | Status: DC

## 2018-02-14 MED ORDER — EPHEDRINE 5 MG/ML INJ
10.0000 mg | INTRAVENOUS | Status: DC | PRN
  Filled 2018-02-14: qty 2

## 2018-02-14 MED ORDER — ACETAMINOPHEN 325 MG PO TABS
650.0000 mg | ORAL_TABLET | ORAL | Status: DC | PRN
  Administered 2018-02-14 (×2): 650 mg via ORAL
  Filled 2018-02-14 (×2): qty 2

## 2018-02-14 MED ORDER — BENZOCAINE-MENTHOL 20-0.5 % EX AERO
1.0000 "application " | INHALATION_SPRAY | CUTANEOUS | Status: DC | PRN
  Administered 2018-02-14: 1 via TOPICAL
  Filled 2018-02-14: qty 56

## 2018-02-14 MED ORDER — IBUPROFEN 600 MG PO TABS
600.0000 mg | ORAL_TABLET | Freq: Four times a day (QID) | ORAL | Status: DC
  Administered 2018-02-14: 600 mg via ORAL
  Filled 2018-02-14: qty 1

## 2018-02-14 NOTE — Lactation Note (Signed)
This note was copied from a baby's chart. Lactation Consultation Note  Patient Name: Girl Joneen BoersKarimah Abdul-Hameed ZOXWR'UToday's Date: 02/14/2018 Reason for consult: Initial assessment   P3, Baby 8 hours old and room full of visitors.  Mother states she breastfed one child for 2 weeks and the other for one month. Denies concerns or questions. Mom encouraged to feed baby 8-12 times/24 hours and with feeding cues.  Mom made aware of O/P services, breastfeeding support groups, community resources, and our phone # for post-discharge questions.     Maternal Data Has patient been taught Hand Expression?: Yes(per mother) Does the patient have breastfeeding experience prior to this delivery?: Yes  Feeding Feeding Type: Breast Fed Length of feed: 10 min  LATCH Score                   Interventions Interventions: Breast feeding basics reviewed  Lactation Tools Discussed/Used     Consult Status Consult Status: Follow-up Date: 02/15/18 Follow-up type: In-patient    Dahlia ByesBerkelhammer, [facility] 02/14/2018, 2:53 PM

## 2018-02-14 NOTE — Anesthesia Postprocedure Evaluation (Signed)
Anesthesia Post Note  Patient: Teresa Glenn  Procedure(s) Performed: AN AD HOC LABOR EPIDURAL     Patient location during evaluation: Mother Baby Anesthesia Type: Epidural Level of consciousness: awake Pain management: satisfactory to patient Vital Signs Assessment: post-procedure vital signs reviewed and stable Respiratory status: spontaneous breathing Cardiovascular status: stable Anesthetic complications: no    Last Vitals:  Vitals:   02/14/18 0940 02/14/18 1315  BP: 124/84 117/72  Pulse: 97 91  Resp: 18 18  Temp: 37.1 C 37.1 C  SpO2: 100% 100%    Last Pain:  Vitals:   02/14/18 1315  TempSrc: Oral  PainSc:    Pain Goal:                 KeyCorpBURGER,[name]

## 2018-02-14 NOTE — Progress Notes (Signed)
LABOR PROGRESS NOTE  Teresa DoffingKarimah S Glenn is a 37 y.o. [id number] at 6147w1d  admitted for IOL for PD   Subjective: Patient had SROM at 2358- clear fluid. Patient now comfortable with epidural.   Objective: BP 106/75   Pulse 90   Temp 98.1 F (36.7 C) (Oral)   Resp 16   Ht 5\' 5"  (1.651 m)   Wt 172 lb (78 kg)   LMP 05/02/2017   SpO2 100%   BMI 28.62 kg/m  or  Vitals:   02/14/18 0145 02/14/18 0147 02/14/18 0149 02/14/18 0150  BP: 102/69 (!) 86/52 101/70 106/75  Pulse: 85 93 82 90  Resp:      Temp:      TempSrc:      SpO2: 96%   100%  Weight:      Height:        Dilation: 8 Effacement (%): 90 Cervical Position: Posterior Station: -1 Presentation: Vertex Exam by:: Minus Libertyhristy Leshowitz, RN FHT: baseline rate 140, minimal/moderate varibility, +acel, early decel Toco: 2-3/ strong by palpation   Labs: Lab Results  Component Value Date   WBC 10.6 (H) 02/13/2018   HGB 8.9 (L) 02/13/2018   HCT 29.6 (L) 02/13/2018   MCV 71.3 (L) 02/13/2018   PLT 221 02/13/2018    Patient Active Problem List   Diagnosis Date Noted  . Post-dates pregnancy 02/13/2018    Assessment / Plan: 37 y.o. [id number] at 8547w1d here for IOL for PD   Labor: Progressing well after SROM  Fetal Wellbeing:  Cat I  Pain Control:  Epidural  Anticipated MOD:  SVD   Sharyon CableRogers, Syrai Gladwin C, CNM 02/14/2018, 2:00 AM
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
## 2018-02-14 NOTE — Anesthesia Procedure Notes (Signed)
Epidural Patient location during procedure: OB Start time: 02/14/2018 12:54 AM End time: 02/14/2018 1:04 AM  Staffing Anesthesiologist: Beryle LatheBrock, Thomas E, MD Performed: anesthesiologist   Preanesthetic Checklist Completed: patient identified, pre-op evaluation, timeout performed, IV checked, risks and benefits discussed and monitors and equipment checked  Epidural Patient position: sitting Prep: DuraPrep Patient monitoring: continuous pulse ox and blood pressure Approach: right paramedian (1st attempt midline unsuccessful. 2nd attempt Right paramedian.) Location: L3-L4 Injection technique: LOR saline  Needle:  Needle type: Tuohy  Needle gauge: 17 G Needle length: 9 cm Needle insertion depth: 7 cm Catheter size: 19 Gauge Catheter at skin depth: 12 cm Test dose: negative and Other (1% lidocaine)  Additional Notes Patient identified. Risks including, but not limited to, bleeding, infection, nerve damage, paralysis, inadequate analgesia, blood pressure changes, nausea, vomiting, allergic reaction, postpartum back pain, itching, and headache were discussed. Patient expressed understanding and wished to proceed. Sterile prep and drape, including hand hygiene, mask, and sterile gloves were used. The patient was positioned and the spine was prepped. The skin was anesthetized with lidocaine. No paraesthesia or other complication noted. The patient did not experience any signs of intravascular injection such as tinnitus or metallic taste in mouth, nor signs of intrathecal spread such as rapid motor block. Please see nursing notes for vital signs. The patient tolerated the procedure well.   Leslye Peerhomas Brock, MDReason for block:procedure for pain

## 2018-02-14 NOTE — Anesthesia Preprocedure Evaluation (Signed)
Anesthesia Evaluation  Patient identified by MRN, date of birth, ID band Patient awake    Reviewed: Allergy & Precautions, NPO status , Patient's Chart, lab work & pertinent test results  History of Anesthesia Complications Negative for: history of anesthetic complications  Airway Mallampati: II  TM Distance: >3 FB Neck ROM: Full    Dental  (+) Dental Advisory Given   Pulmonary neg pulmonary ROS,    breath sounds clear to auscultation       Cardiovascular negative cardio ROS   Rhythm:Regular Rate:Normal     Neuro/Psych negative neurological ROS  negative psych ROS   GI/Hepatic negative GI ROS, Neg liver ROS,   Endo/Other  negative endocrine ROS  Renal/GU negative Renal ROS  negative genitourinary   Musculoskeletal negative musculoskeletal ROS (+)   Abdominal   Peds  Hematology  (+) anemia ,   Anesthesia Other Findings   Reproductive/Obstetrics (+) Pregnancy                             Anesthesia Physical Anesthesia Plan  ASA: II  Anesthesia Plan: Epidural   Post-op Pain Management:    Induction:   PONV Risk Score and Plan:   Airway Management Planned: Natural Airway  Additional Equipment: None  Intra-op Plan:   Post-operative Plan:   Informed Consent: I have reviewed the patients History and Physical, chart, labs and discussed the procedure including the risks, benefits and alternatives for the proposed anesthesia with the patient or authorized representative who has indicated his/her understanding and acceptance.     Plan Discussed with: Anesthesiologist  Anesthesia Plan Comments: (Labs reviewed. Platelets acceptable, patient not taking any blood thinning medications. Per RN, FHR tracing reported to be stable enough for sitting procedure. Risks and benefits discussed with patient, including PDPH, backache, failed epidural, allergic reaction, and nerve injury. Patient  expressed understanding and wished to proceed.)        Anesthesia Quick Evaluation

## 2018-02-15 NOTE — Progress Notes (Signed)
Post Partum Day #1 Subjective: no complaints, up ad lib and tolerating PO; breastfeeding; plans on POPs for pp contraception  Objective: Blood pressure 109/72, pulse 68, temperature 98.2 F (36.8 C), temperature source Oral, resp. rate 18, height 5\' 5"  (1.651 m), weight 78 kg (172 lb), last menstrual period 05/02/2017, SpO2 100 %, unknown if currently breastfeeding.  Physical Exam:  General: alert, cooperative and no distress Lochia: appropriate Uterine Fundus: firm DVT Evaluation: No evidence of DVT seen on physical exam.  Recent Labs    02/13/18 0730  HGB 8.9*  HCT 29.6*    Assessment/Plan: Plan for discharge tomorrow   LOS: 2 days   SHAW, KIMBERLY 02/15/2018, 10:15 AM

## 2018-02-16 MED ORDER — DOCUSATE SODIUM 100 MG PO CAPS: 100.0000 mg | ORAL_CAPSULE | Freq: Every day | ORAL | 2 refills | Status: AC | PRN

## 2018-02-16 MED ORDER — IBUPROFEN 600 MG PO TABS: 600.0000 mg | ORAL_TABLET | Freq: Four times a day (QID) | ORAL | 0 refills | Status: DC

## 2018-02-16 NOTE — Discharge Summary (Addendum)
OB Discharge Summary     Patient Name: Teresa DoffingKarimah S Abdul-Hameed DOB: [birth year] MRN: [id number]  Date of admission: 02/13/2018 Delivering MD: Sharyon CableOGERS, VERONICA C   Date of discharge: 02/16/2018  Admitting diagnosis: INDUCTION Intrauterine pregnancy: 7989w1d     Secondary diagnosis:  Active Problems:   Post-dates pregnancy   SVD (spontaneous vaginal delivery)   Shoulder dystocia during labor and delivery  Additional problems: none     Discharge diagnosis: Term Pregnancy Delivered                                                                                                Post partum procedures:none  Augmentation: Pitocin  Complications: None  Hospital course:  Induction of Labor With Vaginal Delivery   37 y.o. yo [id number] at 2089w1d was admitted to the hospital 02/13/2018 for induction of labor.  Indication for induction: Postdates.  Patient had an uncomplicated labor course as follows: Membrane Rupture Time/Date: 11:58 PM ,02/13/2018   Intrapartum Procedures: Episiotomy: None [1]                                         Lacerations:  Labial [10]  Patient had delivery of a Viable infant.  Information for the patient's newborn:  Teresa Glenn, Girl Eugene GarnetKarimah [[id number]][[id number]]  Delivery Method: Vaginal, Spontaneous(Filed from Delivery Summary)   02/14/2018  Details of delivery can be found in separate delivery note.  Patient had a routine postpartum course. Patient is discharged home 02/16/18.  Physical exam  Vitals:   02/15/18 0600 02/15/18 1443 02/15/18 2232 02/16/18 0553  BP: 109/72 109/80 121/76 (!) 99/58  Pulse: 68 78 71 72  Resp: 18 18 17    Temp: 98.2 F (36.8 C) 98.9 F (37.2 C) 98.5 F (36.9 C) 98.7 F (37.1 C)  TempSrc: Oral Oral Oral Oral  SpO2: 100% 100% 97%   Weight:      Height:       General: alert, cooperative and no distress Lochia: appropriate Uterine Fundus: firm Incision: Healing well with no significant drainage DVT Evaluation: No evidence of DVT seen on  physical exam. Labs: Lab Results  Component Value Date   WBC 10.6 (H) 02/13/2018   HGB 8.9 (L) 02/13/2018   HCT 29.6 (L) 02/13/2018   MCV 71.3 (L) 02/13/2018   PLT 221 02/13/2018   CMP Latest Ref Rng & Units 11/17/2017  Glucose 65 - 99 mg/dL 85  BUN 6 - 20 mg/dL 5(L)  Creatinine 1.300.44 - 1.00 mg/dL 8.650.59  Sodium 784135 - 696145 mmol/L 136  Potassium 3.5 - 5.1 mmol/L 4.3  Chloride 101 - 111 mmol/L 104  CO2 22 - 32 mmol/L 22  Calcium 8.9 - 10.3 mg/dL 9.0  Total Protein 6.5 - 8.1 g/dL 6.6  Total Bilirubin 0.3 - 1.2 mg/dL 0.5  Alkaline Phos 38 - 126 U/L 59  AST 15 - 41 U/L 22  ALT 14 - 54 U/L 13(L)    Discharge instruction: per After Visit Summary and "Baby and Me Booklet".  After visit  meds:  Allergies as of 02/16/2018   No Known Allergies     Medication List    STOP taking these medications   cyclobenzaprine 10 MG tablet Commonly known as:  FLEXERIL   VITAFOL GUMMIES 3.33-0.333-34.8 MG Chew     TAKE these medications   docusate sodium 100 MG capsule Commonly known as:  COLACE Take 1 capsule (100 mg total) by mouth daily as needed.   ferrous sulfate 325 (65 FE) MG tablet Take 1 tablet (325 mg total) by mouth 2 (two) times daily with a meal. What changed:  when to take this   ibuprofen 600 MG tablet Commonly known as:  ADVIL,MOTRIN Take 1 tablet (600 mg total) by mouth every 6 (six) hours.       Diet: routine diet  Activity: Advance as tolerated. Pelvic rest for 6 weeks.   Outpatient follow up:4 week Follow up Appt:No future appointments. Follow up Visit:No follow-ups on file.  Postpartum contraception: Progesterone only pills  Newborn Data: Live born female  Birth Weight: 9 lb 11.7 oz (4415 g) APGAR: 8, 9  Newborn Delivery   Time head delivered:  02/14/2018 06:33:00 Birth date/time:  02/14/2018 06:34:00 Delivery type:  Vaginal, Spontaneous     Baby Feeding: Breast Disposition:home with mother   02/16/2018 Sandre Kitty, MD  I confirm that I have  verified the information documented in the resident's note and that I have also personally reperformed the physical exam and all medical decision making activities. Patient was seen and examined by me also Agree with note Vitals stable Labs stable Fundus firm, lochia within normal limits Perineum healing Ext WNL Ready for discharge  Aviva Signs, CNM

## 2018-02-16 NOTE — Discharge Instructions (Signed)
Vaginal Delivery, Care After °Refer to this sheet in the next few weeks. These instructions provide you with information about caring for yourself after vaginal delivery. Your health care provider may also give you more specific instructions. Your treatment has been planned according to current medical practices, but problems sometimes occur. Call your health care provider if you have any problems or questions. °What can I expect after the procedure? °After vaginal delivery, it is common to have: °· Some bleeding from your vagina. °· Soreness in your abdomen, your vagina, and the area of skin between your vaginal opening and your anus (perineum). °· Pelvic cramps. °· Fatigue. ° °Follow these instructions at home: °Medicines °· Take over-the-counter and prescription medicines only as told by your health care provider. °· If you were prescribed an antibiotic medicine, take it as told by your health care provider. Do not stop taking the antibiotic until it is finished. °Driving ° °· Do not drive or operate heavy machinery while taking prescription pain medicine. °· Do not drive for 24 hours if you received a sedative. °Lifestyle °· Do not drink alcohol. This is especially important if you are breastfeeding or taking medicine to relieve pain. °· Do not use tobacco products, including cigarettes, chewing tobacco, or e-cigarettes. If you need help quitting, ask your health care provider. °Eating and drinking °· Drink at least 8 eight-ounce glasses of water every day unless you are told not to by your health care provider. If you choose to breastfeed your baby, you may need to drink more water than this. °· Eat high-fiber foods every day. These foods may help prevent or relieve constipation. High-fiber foods include: °? Whole grain cereals and breads. °? Brown rice. °? Beans. °? Fresh fruits and vegetables. °Activity °· Return to your normal activities as told by your health care provider. Ask your health care provider  what activities are safe for you. °· Rest as much as possible. Try to rest or take a nap when your baby is sleeping. °· Do not lift anything that is heavier than your baby or 10 lb (4.5 kg) until your health care provider says that it is safe. °· Talk with your health care provider about when you can engage in sexual activity. This may depend on your: °? Risk of infection. °? Rate of healing. °? Comfort and desire to engage in sexual activity. °Vaginal Care °· If you have an episiotomy or a vaginal tear, check the area every day for signs of infection. Check for: °? More redness, swelling, or pain. °? More fluid or blood. °? Warmth. °? Pus or a bad smell. °· Do not use tampons or douches until your health care provider says this is safe. °· Watch for any blood clots that may pass from your vagina. These may look like clumps of dark red, brown, or black discharge. °General instructions °· Keep your perineum clean and dry as told by your health care provider. °· Wear loose, comfortable clothing. °· Wipe from front to back when you use the toilet. °· Ask your health care provider if you can shower or take a bath. If you had an episiotomy or a perineal tear during labor and delivery, your health care provider may tell you not to take baths for a certain length of time. °· Wear a bra that supports your breasts and fits you well. °· If possible, have someone help you with household activities and help care for your baby for at least a few days after   you leave the hospital. °· Keep all follow-up visits for you and your baby as told by your health care provider. This is important. °Contact a health care provider if: °· You have: °? Vaginal discharge that has a bad smell. °? Difficulty urinating. °? Pain when urinating. °? A sudden increase or decrease in the frequency of your bowel movements. °? More redness, swelling, or pain around your episiotomy or vaginal tear. °? More fluid or blood coming from your episiotomy or  vaginal tear. °? Pus or a bad smell coming from your episiotomy or vaginal tear. °? A fever. °? A rash. °? Little or no interest in activities you used to enjoy. °? Questions about caring for yourself or your baby. °· Your episiotomy or vaginal tear feels warm to the touch. °· Your episiotomy or vaginal tear is separating or does not appear to be healing. °· Your breasts are painful, hard, or turn red. °· You feel unusually sad or worried. °· You feel nauseous or you vomit. °· You pass large blood clots from your vagina. If you pass a blood clot from your vagina, save it to show to your health care provider. Do not flush blood clots down the toilet without having your health care provider look at them. °· You urinate more than usual. °· You are dizzy or light-headed. °· You have not breastfed at all and you have not had a menstrual period for 12 weeks after delivery. °· You have stopped breastfeeding and you have not had a menstrual period for 12 weeks after you stopped breastfeeding. °Get help right away if: °· You have: °? Pain that does not go away or does not get better with medicine. °? Chest pain. °? Difficulty breathing. °? Blurred vision or spots in your vision. °? Thoughts about hurting yourself or your baby. °· You develop pain in your abdomen or in one of your legs. °· You develop a severe headache. °· You faint. °· You bleed from your vagina so much that you fill two sanitary pads in one hour. °This information is not intended to replace advice given to you by your health care provider. Make sure you discuss any questions you have with your health care provider. °Document Released: 07/09/2000 Document Revised: 12/24/2015 Document Reviewed: 07/27/2015 °Elsevier Interactive Patient Education © 2018 [employer] ° °

## 2018-02-16 NOTE — Lactation Note (Signed)
This note was copied from a baby's chart. Lactation Consultation Note  Patient Name: Teresa Glenn: 02/16/2018 Reason for consult: Follow-up assessment;Early term 37-38.6wks;Infant weight loss   Follow up with mom of 52 hour old infant. Infant with 8 BF for 10-60 minutes, 4 BF attempts, 4 voids and 1 stool in the last 24 hours. LATCH scores 8-10. Infant weight 9 pounds 0.8 ounces with 7% weight loss since birth. Updated voids per mom's recall. Enc mom to keep feeding log.   Infant was awakened by NP exam. Mom latched infant to the left breast independently in the cradle hold. Enc mom to use the cross cradle hold for latch. Mom did change her hand placement and infant did latch deeply. Infant was sleepy at first, mom did well with stimulating her and getting her to actively feed. Mom able to express large gtts colostrum easily. Infant with good swallows at the breast.   Enc mom to continue feeding infant STS 8-12 x in 24 hours with first feeding cues. Enc mom to offer both breasts with each feeding. Enc mom to keep infant awake as needed at the breast. Enc mom to massage/compress breast with feedings to maximize milk transfer.   Reviewed I/O, signs of dehydration in the infant, how to know your infant is getting enough, pacifier usage (infant with pacifier in the crib), engorgement prevention/treatment, pre pumping, comfort pumping,  and breast milk storage and expression.   Mom reports she has no questions/concerns. Mom to call out for feeding assistance as needed. Mom has spoken with WIC. Mom has [employer], enc her to call them to see if they provide pumps for their clients. Mom was given manual pump with instructions for assembling, disassembling , pumping and cleaning.    Maternal Data Formula Feeding for Exclusion: No Has patient been taught Hand Expression?: Yes Does the patient have breastfeeding experience prior to this delivery?: Yes  Feeding Feeding Type: Breast  Fed Length of feed: 10 min(infant still feeding when LC left the room)  LATCH Score Latch: Repeated attempts needed to sustain latch, nipple held in mouth throughout feeding, stimulation needed to elicit sucking reflex.  Audible Swallowing: Spontaneous and intermittent  Type of Nipple: Everted at rest and after stimulation  Comfort (Breast/Nipple): Soft / non-tender  Hold (Positioning): Assistance needed to correctly position infant at breast and maintain latch.  LATCH Score: 8  Interventions Interventions: Breast feeding basics reviewed;Support pillows;Assisted with latch;Position options;Skin to skin;Hand express;Breast compression;Breast massage;Hand pump  Lactation Tools Discussed/Used WIC Program: Yes Pump Review: Milk Storage;Setup, frequency, and cleaning Initiated by:: Max FickleS. Lavetta Geier, RN   Consult Status Consult Status: Complete Follow-up type: Call as needed    Ed BlalockSharon S Ammiel Guiney 02/16/2018, 11:06 AM

## 2018-12-25 ENCOUNTER — Other Ambulatory Visit: Payer: Self-pay

## 2018-12-25 ENCOUNTER — Ambulatory Visit (HOSPITAL_COMMUNITY)
Admission: EM | Admit: 2018-12-25 | Discharge: 2018-12-25 | Disposition: A | Discharge status: Home / Self Care | Payer: BC Managed Care – PPO | Attending: Family Medicine | Admitting: Family Medicine

## 2018-12-25 ENCOUNTER — Encounter (HOSPITAL_COMMUNITY): Payer: Self-pay | Admitting: Family Medicine

## 2018-12-25 DIAGNOSIS — N390 Urinary tract infection, site not specified: Secondary | ICD-10-CM | POA: Diagnosis not present

## 2018-12-25 LAB — POCT URINALYSIS DIP (DEVICE)
Bilirubin Urine: NEGATIVE
Glucose, UA: NEGATIVE mg/dL
Ketones, ur: NEGATIVE mg/dL
Nitrite: POSITIVE — AB
Protein, ur: 100 mg/dL — AB
Specific Gravity, Urine: 1.025 (ref 1.005–1.030)
Urobilinogen, UA: 1 mg/dL (ref 0.0–1.0)
pH: 7.5 (ref 5.0–8.0)

## 2018-12-25 LAB — POCT PREGNANCY, URINE: Preg Test, Ur: NEGATIVE

## 2018-12-25 MED ORDER — CEPHALEXIN 500 MG PO CAPS: 500.0000 mg | ORAL_CAPSULE | Freq: Four times a day (QID) | ORAL | 0 refills | Status: DC

## 2018-12-25 NOTE — ED Triage Notes (Signed)
Pt here for vaginal discharge and dysuria

## 2018-12-25 NOTE — Discharge Instructions (Addendum)
The urine shows an infection.  Take the antibiotic 4 times a day for 5 days.  Symptoms should clear by Wednesday evening.  We are also running STD tests which should be back tomorrow sometime.  We'll notify you if any are positive.
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
## 2018-12-26 LAB — CERVICOVAGINAL ANCILLARY ONLY
Chlamydia: NEGATIVE
Neisseria Gonorrhea: NEGATIVE
Trichomonas: NEGATIVE

## 2018-12-27 LAB — URINE CULTURE: Culture: >=100000 — AB

## 2018-12-28 ENCOUNTER — Telehealth (HOSPITAL_COMMUNITY): Payer: Self-pay | Admitting: Emergency Medicine

## 2018-12-28 NOTE — Telephone Encounter (Signed)
Urine culture was positive for Escherichia coli and was given keflex  at urgent care visit. Pt contacted and made aware, educated on completing antibiotic and to follow up if symptoms are persistent. Verbalized understanding.   Attempted call no answer and voicemail full

## 2019-06-06 IMAGING — CT CT ANGIO CHEST
1 of 2 series · 19 of 32 positions shown · IV contrast (OMNIPAQUE)
Comparison: None.

CLINICAL DATA: Sudden onset shortness of breath, patient is 28
weeks pregnant

EXAM:
CT ANGIOGRAPHY CHEST WITH CONTRAST
TECHNIQUE: Multidetector CT imaging of the chest was performed using the
standard protocol during bolus administration of intravenous
contrast. Multiplanar CT image reconstructions and MIPs were
obtained to evaluate the vascular anatomy.
CONTRAST:  100mL DRB9WI-[id number] IOPAMIDOL (DRB9WI-[id number]) INJECTION 76%

[Series 5: (person_name) thins · axial · 0.65mm/px · z∈[+871,+1153]mm · 19 of 449 slices shown]
[im 23/449  lung]
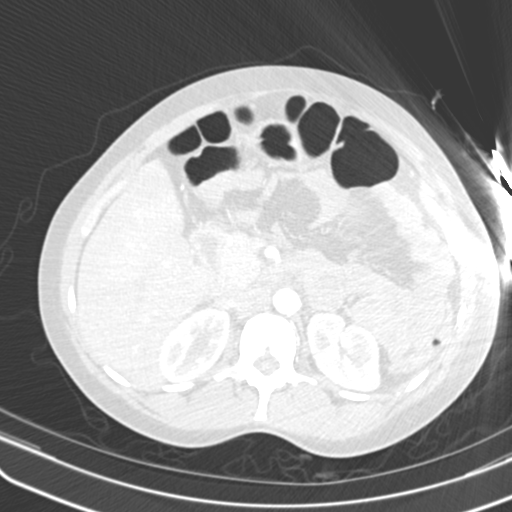
[im 45/449  mediastinal]
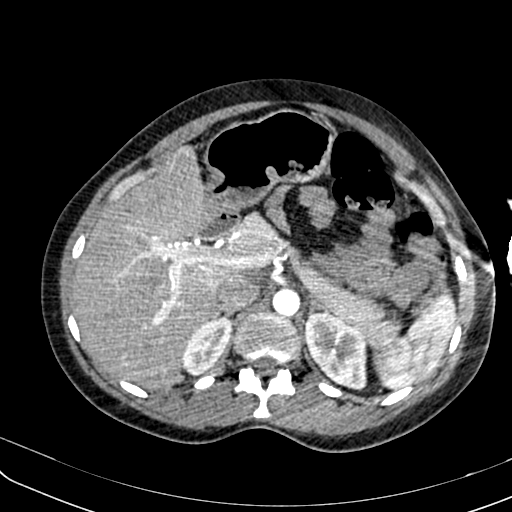
[im 68/449  lung]
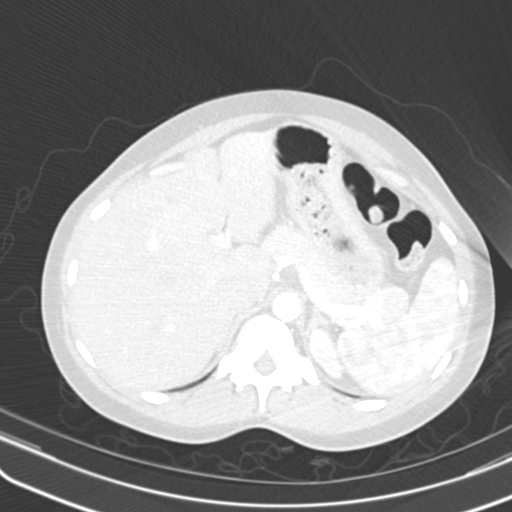
[im 113/449  mediastinal]
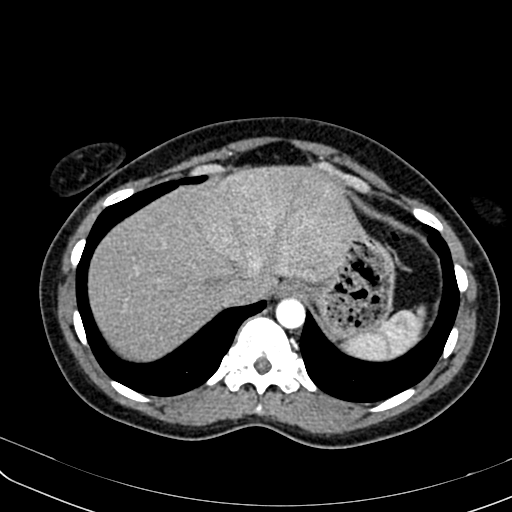
[im 135/449  lung]
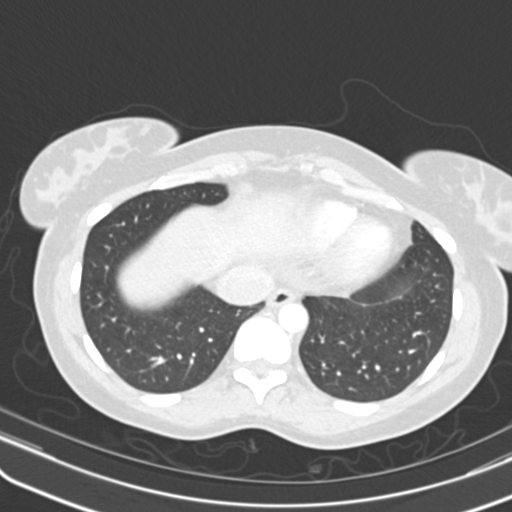
[im 150/449  mediastinal]
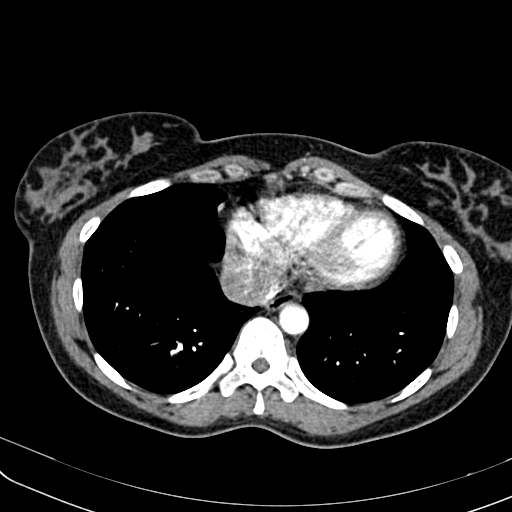
[im 157/449  lung]
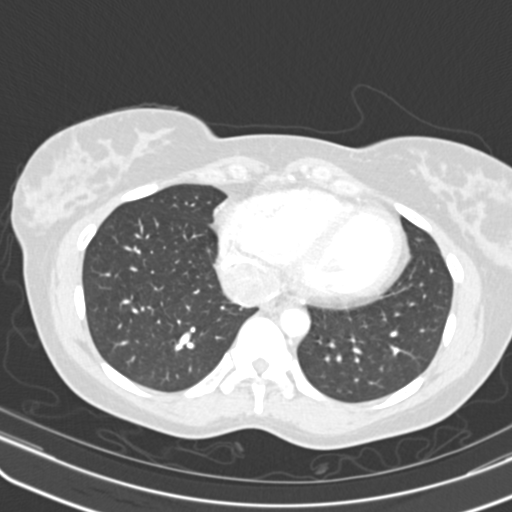
[im 180/449  mediastinal]
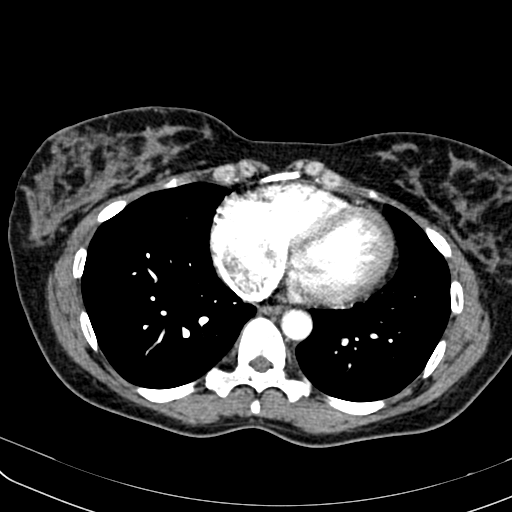
[im 202/449  lung]
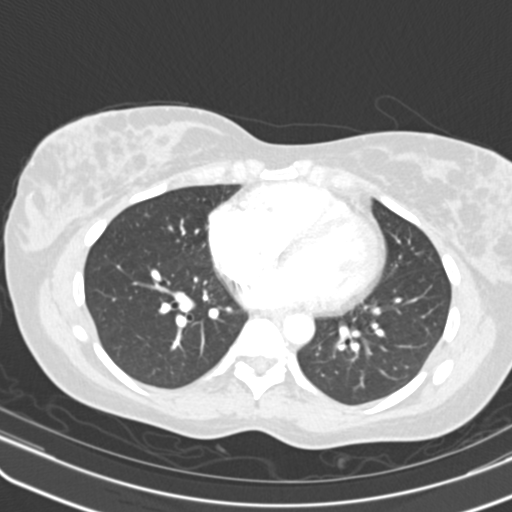
[im 225/449  mediastinal]
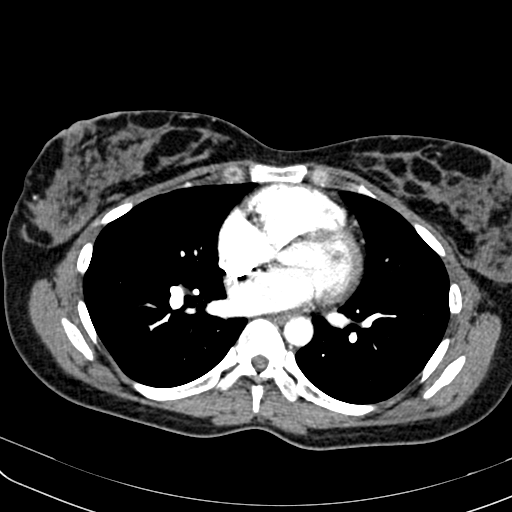
[im 247/449  lung]
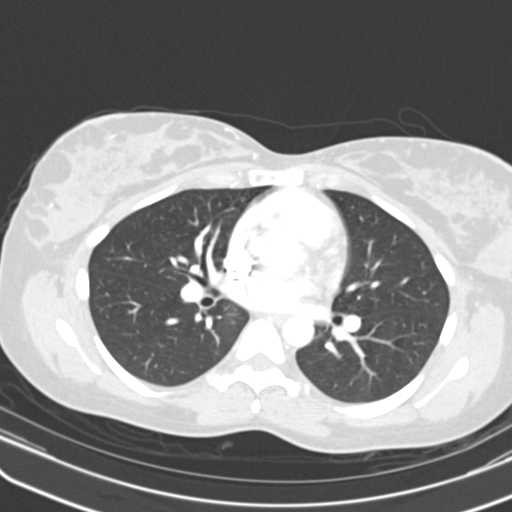
[im 269/449  mediastinal]
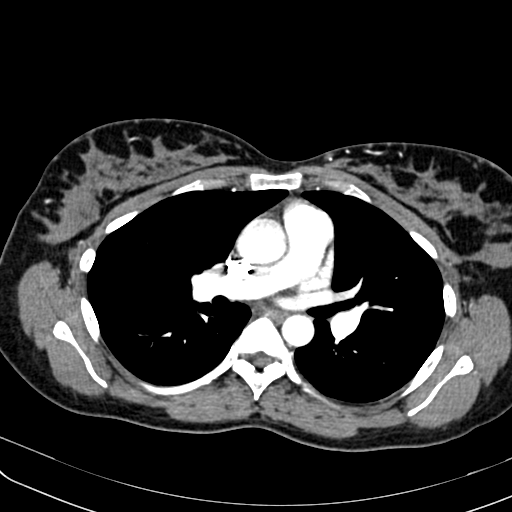
[im 292/449  lung]
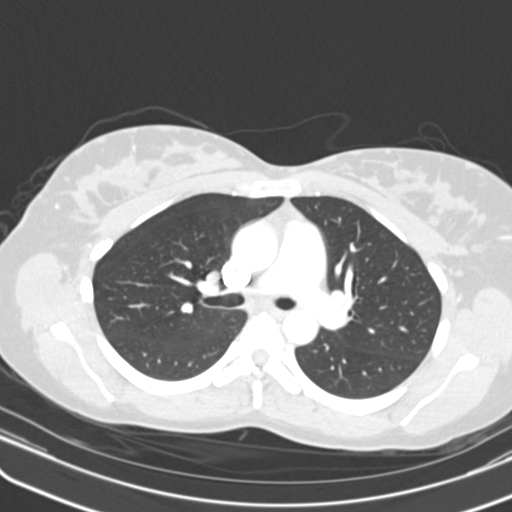
[im 299/449  mediastinal]
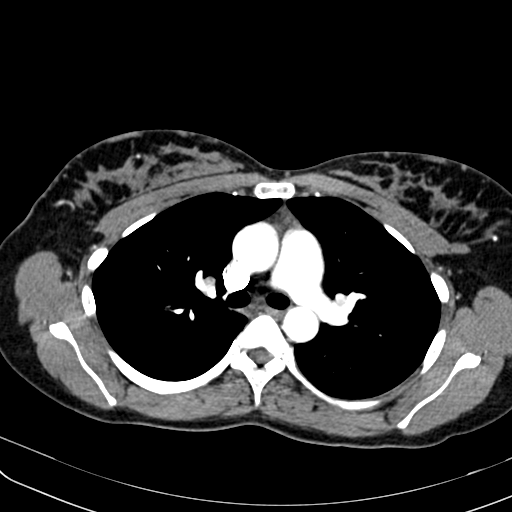
[im 314/449  lung]
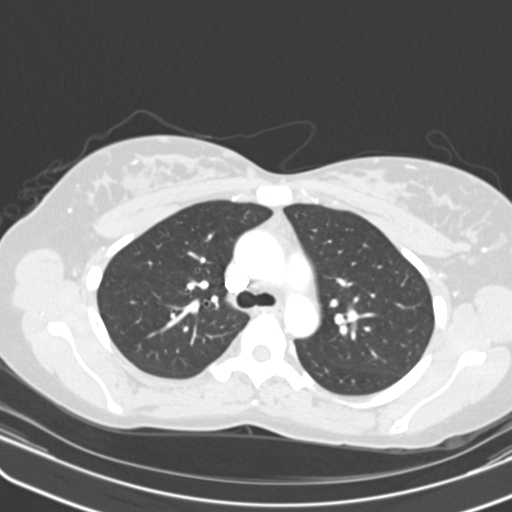
[im 337/449  mediastinal]
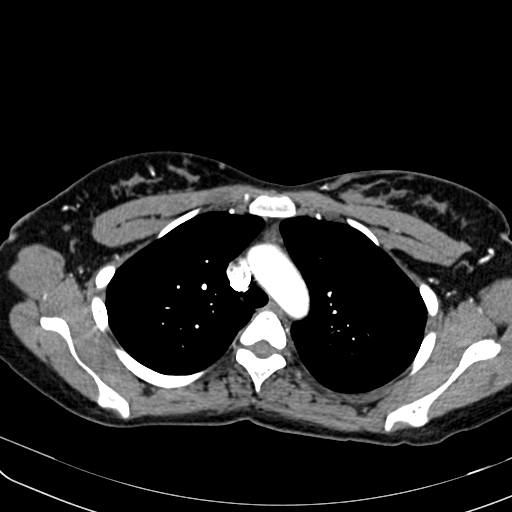
[im 381/449  lung]
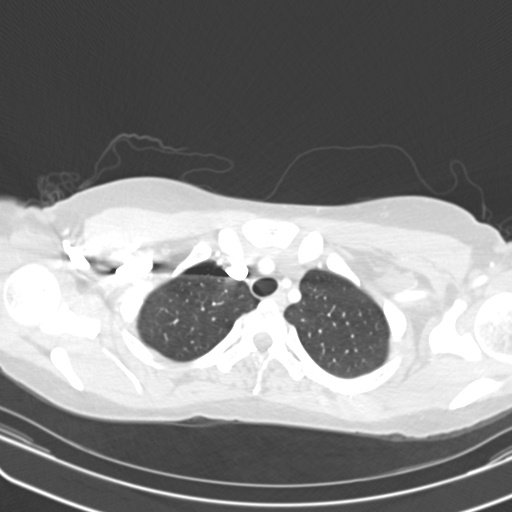
[im 404/449  mediastinal]
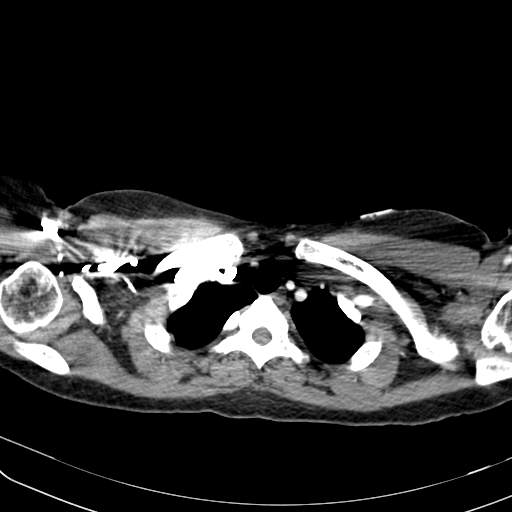
[im 426/449  lung]
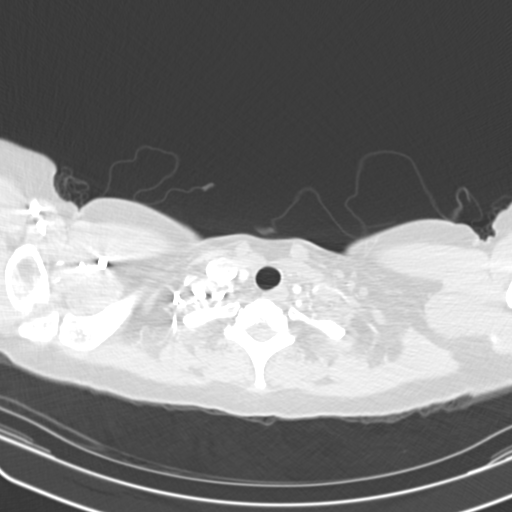

[19 of 32 positions shown; findings below may reference images not displayed]

FINDINGS: Cardiovascular: Satisfactory opacification of the pulmonary arteries
to the segmental level. No evidence of pulmonary embolism. Normal
heart size. No pericardial effusion. Nonaneurysmal aorta. No
dissection.

Mediastinum/Nodes: No enlarged mediastinal, hilar, or axillary lymph
nodes. Thyroid gland, trachea, and esophagus demonstrate no
significant findings.

Lungs/Pleura: Lungs are clear. No pleural effusion or pneumothorax.

Upper Abdomen: No acute abnormality. Small hyperenhancing focus near
the dome of the liver, could represent small flash hemangioma.

Musculoskeletal: No chest wall abnormality. No acute or significant
osseous findings.

Review of the MIP images confirms the above findings.
IMPRESSION: 1. Negative for acute pulmonary embolus or aortic dissection
2. Clear lung fields

## 2021-03-05 ENCOUNTER — Ambulatory Visit (HOSPITAL_COMMUNITY)
Admission: EM | Admit: 2021-03-05 | Discharge: 2021-03-05 | Disposition: A | Discharge status: Home / Self Care | Payer: BC Managed Care – PPO | Attending: Emergency Medicine | Admitting: Emergency Medicine

## 2021-03-05 ENCOUNTER — Ambulatory Visit (INDEPENDENT_AMBULATORY_CARE_PROVIDER_SITE_OTHER): Payer: BC Managed Care – PPO

## 2021-03-05 ENCOUNTER — Other Ambulatory Visit: Payer: Self-pay

## 2021-03-05 ENCOUNTER — Encounter (HOSPITAL_COMMUNITY): Payer: Self-pay

## 2021-03-05 DIAGNOSIS — M545 Low back pain, unspecified: Secondary | ICD-10-CM

## 2021-03-05 DIAGNOSIS — K5900 Constipation, unspecified: Secondary | ICD-10-CM | POA: Diagnosis not present

## 2021-03-05 LAB — POCT URINALYSIS DIPSTICK, ED / UC
Bilirubin Urine: NEGATIVE
Glucose, UA: NEGATIVE mg/dL
Ketones, ur: NEGATIVE mg/dL
Leukocytes,Ua: NEGATIVE
Nitrite: NEGATIVE
Protein, ur: NEGATIVE mg/dL
Specific Gravity, Urine: 1.025 (ref 1.005–1.030)
Urobilinogen, UA: 0.2 mg/dL (ref 0.0–1.0)
pH: 5.5 (ref 5.0–8.0)

## 2021-03-05 LAB — POC URINE PREG, ED: Preg Test, Ur: NEGATIVE
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
## 2021-03-05 NOTE — Discharge Instructions (Addendum)
You can take Tylenol and/or Ibuprofen as needed for pain relief and fever reduction.   Take Miralax daily until you have a good complete bowel movement.  You can take colace or a stool softener if your stool is hard or if its painful to have a bowel movement.   Make sure you are drinking of fluids, especially water, and try to increase the amount of fiber in your diet.    Return or go to the Emergency Department if symptoms worsen or do not improve in the next few days.

## 2021-03-05 NOTE — ED Triage Notes (Signed)
Pt c/o lower back pain and vaginal discharge X 2 days.   States the vaginal discharge is a dark red color. Denies painful urination and bowel movements.

## 2023-07-07 ENCOUNTER — Encounter (HOSPITAL_COMMUNITY): Payer: Self-pay

## 2023-07-07 ENCOUNTER — Ambulatory Visit (HOSPITAL_COMMUNITY)
Admission: EM | Admit: 2023-07-07 | Discharge: 2023-07-07 | Disposition: A | Discharge status: Home / Self Care | Payer: BC Managed Care – PPO | Attending: Internal Medicine | Admitting: Internal Medicine

## 2023-07-07 DIAGNOSIS — B9689 Other specified bacterial agents as the cause of diseases classified elsewhere: Secondary | ICD-10-CM

## 2023-07-07 DIAGNOSIS — J019 Acute sinusitis, unspecified: Secondary | ICD-10-CM

## 2023-07-07 MED ORDER — AMOXICILLIN-POT CLAVULANATE 875-125 MG PO TABS: 1.0000 | ORAL_TABLET | Freq: Two times a day (BID) | ORAL | 0 refills | Status: AC

## 2023-07-07 MED ORDER — FLUTICASONE PROPIONATE 50 MCG/ACT NA SUSP: 1.0000 | Freq: Every day | NASAL | 0 refills | Status: AC

## 2023-07-07 NOTE — Discharge Instructions (Addendum)
Humidifier and vapor rub use at bedtime will help with nasal congestion Please take antibiotics as directed Use other prescription medications as directed. Tylenol of ibuprofen as needed for pain. Please feel free to return to the urgent care if you have worsening symptoms
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
## 2023-07-07 NOTE — ED Triage Notes (Signed)
Pt presents with nasal congestion, headaches, sinus pressure, green and yellow sputum x 1 week. Pt currently rates her pain a 5/10. Pt states she has been taking OTC sinus medication and alternating Tylenol/Ibuprofen with little relief.

## 2024-01-11 ENCOUNTER — Telehealth: Payer: Self-pay

## 2024-01-11 ENCOUNTER — Other Ambulatory Visit: Payer: Self-pay | Admitting: Obstetrics & Gynecology

## 2024-01-11 DIAGNOSIS — Z1231 Encounter for screening mammogram for malignant neoplasm of breast: Secondary | ICD-10-CM

## 2024-01-11 NOTE — Telephone Encounter (Signed)
 Telephoned patient at mobile number. Left a voice message with [facility] scheduling contact information.

## 2024-03-15 ENCOUNTER — Inpatient Hospital Stay: Admission: RE | Admit: 2024-03-15 | Source: Ambulatory Visit

## 2024-04-04 ENCOUNTER — Encounter (HOSPITAL_COMMUNITY): Payer: Self-pay

## 2024-04-04 ENCOUNTER — Other Ambulatory Visit: Payer: Self-pay

## 2024-04-04 ENCOUNTER — Emergency Department (HOSPITAL_COMMUNITY)
Admission: EM | Admit: 2024-04-04 | Discharge: 2024-04-05 | Disposition: A | Discharge status: Home / Self Care | Payer: Self-pay | Attending: Emergency Medicine | Admitting: Emergency Medicine

## 2024-04-04 DIAGNOSIS — R531 Weakness: Secondary | ICD-10-CM | POA: Insufficient documentation

## 2024-04-04 DIAGNOSIS — R6881 Early satiety: Secondary | ICD-10-CM | POA: Insufficient documentation

## 2024-04-04 DIAGNOSIS — R109 Unspecified abdominal pain: Secondary | ICD-10-CM | POA: Insufficient documentation

## 2024-04-04 LAB — BASIC METABOLIC PANEL WITH GFR
Anion gap: 13 (ref 5–15)
BUN: 5 mg/dL — ABNORMAL LOW (ref 6–20)
CO2: 23 mmol/L (ref 22–32)
Calcium: 9.5 mg/dL (ref 8.9–10.3)
Chloride: 104 mmol/L (ref 98–111)
Creatinine, Ser: 0.87 mg/dL (ref 0.44–1.00)
GFR, Estimated: >60 mL/min (ref >60)
Glucose, Bld: 94 mg/dL (ref 70–99)
Potassium: 4 mmol/L (ref 3.5–5.1)
Sodium: 140 mmol/L (ref 135–145)

## 2024-04-04 LAB — CBC
HCT: 37.7 % (ref 36.0–46.0)
Hemoglobin: 11.5 g/dL — ABNORMAL LOW (ref 12.0–15.0)
MCH: 25.1 pg — ABNORMAL LOW (ref 26.0–34.0)
MCHC: 30.5 g/dL (ref 30.0–36.0)
MCV: 82.1 fL (ref 80.0–100.0)
Platelets: 325 K/uL (ref 150–400)
RBC: 4.59 MIL/uL (ref 3.87–5.11)
RDW: 17 % — ABNORMAL HIGH (ref 11.5–15.5)
WBC: 6.9 K/uL (ref 4.0–10.5)
nRBC: 0 % (ref 0.0–0.2)

## 2024-04-04 LAB — HCG, SERUM, QUALITATIVE: Preg, Serum: NEGATIVE

## 2024-04-04 NOTE — ED Provider Triage Note (Signed)
 Emergency Medicine Provider Triage Evaluation Note  Teresa Glenn , a 43 y.o. female  was evaluated in triage.  Pt complains of malnutrition.  Patient tells me she has not been able to eat a full meal for 2 weeks as she feels full quickly.  Denies abdominal pain/nausea/vomiting/dysuria.  Review of Systems  Positive: As above Negative: As above  Physical Exam  BP (!) 179/110   Pulse 79   Temp 98.6 F (37 C)   Resp 14   Ht 5' 5 (1.651 m)   Wt 58.1 kg   LMP 03/04/2024 (Approximate)   SpO2 99%   BMI 21.30 kg/m  Gen:   Awake, no distress   Resp:  Normal effort  MSK:   Moves extremities without difficulty  Other:  Abdomen is soft and nontender to palpation  Medical Decision Making  Medically screening exam initiated at 7:03 PM.  Appropriate orders placed.  Teresa Glenn was informed that the remainder of the evaluation will be completed by another provider, this initial triage assessment does not replace that evaluation, and the importance of remaining in the ED until their evaluation is complete.     Teresa Glenn SAILOR, [place] 04/04/24 1908

## 2024-04-04 NOTE — ED Triage Notes (Signed)
 Pt states she gets full easily and can only take a couple of bites and can not eat any more. C/O weakness from not eating for over 2 weeks. Denies new medications.

## 2024-04-05 ENCOUNTER — Emergency Department (HOSPITAL_COMMUNITY): Payer: Self-pay

## 2024-04-05 LAB — HEPATIC FUNCTION PANEL
ALT: 17 U/L (ref 0–44)
AST: 28 U/L (ref 15–41)
Albumin: 4.4 g/dL (ref 3.5–5.0)
Alkaline Phosphatase: 59 U/L (ref 38–126)
Bilirubin, Direct: 0.3 mg/dL — ABNORMAL HIGH (ref 0.0–0.2)
Indirect Bilirubin: 1.4 mg/dL — ABNORMAL HIGH (ref 0.3–0.9)
Total Bilirubin: 1.7 mg/dL — ABNORMAL HIGH (ref 0.0–1.2)
Total Protein: 8.5 g/dL — ABNORMAL HIGH (ref 6.5–8.1)

## 2024-04-05 LAB — URINALYSIS, W/ REFLEX TO CULTURE (INFECTION SUSPECTED)
Bacteria, UA: NONE SEEN
Bilirubin Urine: NEGATIVE
Glucose, UA: NEGATIVE mg/dL
Hgb urine dipstick: NEGATIVE
Ketones, ur: NEGATIVE mg/dL
Nitrite: NEGATIVE
Protein, ur: NEGATIVE mg/dL
Specific Gravity, Urine: 1.003 — ABNORMAL LOW (ref 1.005–1.030)
pH: 6 (ref 5.0–8.0)

## 2024-04-05 LAB — TSH: TSH: 3.256 u[IU]/mL (ref 0.350–4.500)

## 2024-04-05 LAB — T4, FREE: Free T4: 1.1 ng/dL (ref 0.61–1.12)

## 2024-04-05 MED ORDER — LACTATED RINGERS IV BOLUS
1000.0000 mL | Freq: Once | INTRAVENOUS | Status: AC
  Administered 2024-04-05: 1000 mL via INTRAVENOUS

## 2024-04-05 MED ORDER — IOHEXOL 350 MG/ML SOLN
75.0000 mL | Freq: Once | INTRAVENOUS | Status: AC | PRN
  Administered 2024-04-05: 75 mL via INTRAVENOUS

## 2024-04-05 NOTE — ED Notes (Signed)
 To CT

## 2024-04-05 NOTE — Discharge Instructions (Signed)
 Labs and CT today were reassuring. May need to follow-up with GI if ongoing issues related to eating/drinking.  Probably wise to continue to supplement with ensure, boost, or similar for now to make sure we are getting enough calories/nutrition. Information for local GYN clinic about the cervical polyps-- make a follow-up appt. Return to the ED for new or worsening symptoms.

## 2024-04-05 NOTE — ED Provider Notes (Signed)
 [place] [facility] AT [facility] Provider Note   CSN: [id number] Arrival date & time: 04/04/24  1755     Patient presents with: Weakness and Failure To Thrive   Teresa Glenn is a 43 y.o. female.   The history is provided by the patient and medical records.  Weakness  43 y.o. M with hx of anemia, presenting to the ED with weakness.  Patient reports over the past 2 weeks she has not been able to eat.  States she eats 3-4 bites and begins feeling full very rapidly.  States she has been trying to drink more fluids and supplement with Ensure but continues losing weight and is feeling very generally weak.  She has not had any syncopal events.  She denies any vomiting.  She has been having bowel movements every few days but these are nonbloody and normal in caliber.  Does report had physical a few months ago and had cervical polyps that were found, has not had any follow-up testing with this.  States she was due for procedures with biopsies but may have missed the phone call about this.  She denies any prior history aside from anemia.  Prior to Admission medications   Medication Sig Start Date End Date Taking? Authorizing Provider  amoxicillin -clavulanate (AUGMENTIN ) 875-125 MG tablet Take 1 tablet by mouth every 12 (twelve) hours. 07/07/23   Blaise Aleene KIDD, MD  fluticasone  (FLONASE ) 50 MCG/ACT nasal spray Place 1 spray into both nostrils daily. 07/07/23   Lamptey, Aleene KIDD, MD    Allergies: Patient has no known allergies.    Review of Systems  Neurological:  Positive for weakness.  All other systems reviewed and are negative.   Updated Vital Signs BP (!) 158/110   Pulse 79   Temp 98.6 F (37 C)   Resp 14   Ht 5' 5 (1.651 m)   Wt 58.1 kg   LMP 03/04/2024 (Approximate)   SpO2 99%   BMI 21.30 kg/m   Physical Exam Vitals and nursing note reviewed.  Constitutional:      Appearance: She is well-developed.     Comments: Appears weak and  fatigued  HENT:     Head: Normocephalic and atraumatic.     Mouth/Throat:     Comments: Mildly dry mucous membranes Eyes:     Conjunctiva/sclera: Conjunctivae normal.     Pupils: Pupils are equal, round, and reactive to light.     Comments: Dark circles under the eyes  Cardiovascular:     Rate and Rhythm: Normal rate and regular rhythm.     Heart sounds: Normal heart sounds.  Pulmonary:     Effort: Pulmonary effort is normal.     Breath sounds: Normal breath sounds.  Abdominal:     General: Bowel sounds are normal.     Palpations: Abdomen is soft.     Tenderness: There is no abdominal tenderness. There is no rebound.  Musculoskeletal:        General: Normal range of motion.     Cervical back: Normal range of motion.  Skin:    General: Skin is warm and dry.  Neurological:     Mental Status: She is alert and oriented to person, place, and time.     (all labs ordered are listed, but only abnormal results are displayed) Labs Reviewed  CBC - Abnormal; Notable for the following components:      Result Value   Hemoglobin 11.5 (*)    MCH 25.1 (*)  RDW 17.0 (*)    All other components within normal limits  BASIC METABOLIC PANEL WITH GFR - Abnormal; Notable for the following components:   BUN 5 (*)    All other components within normal limits  URINALYSIS, W/ REFLEX TO CULTURE (INFECTION SUSPECTED) - Abnormal; Notable for the following components:   Color, Urine STRAW (*)    APPearance HAZY (*)    Specific Gravity, Urine 1.003 (*)    Leukocytes,Ua TRACE (*)    All other components within normal limits  HEPATIC FUNCTION PANEL - Abnormal; Notable for the following components:   Total Protein 8.5 (*)    Total Bilirubin 1.7 (*)    Bilirubin, Direct 0.3 (*)    Indirect Bilirubin 1.4 (*)    All other components within normal limits  HCG, SERUM, QUALITATIVE  TSH  T4, FREE    EKG: None  Radiology: CT ABDOMEN PELVIS W CONTRAST Result Date: 04/05/2024 EXAM: CT ABDOMEN AND  PELVIS WITH CONTRAST 04/05/2024 03:16:22 AM TECHNIQUE: CT of the abdomen and pelvis was performed with the administration of intravenous contrast. Multiplanar reformatted images are provided for review. Automated exposure control, iterative reconstruction, and/or weight-based adjustment of the mA/kV was utilized to reduce the radiation dose to as low as reasonably achievable. CONTRAST: 75mL iohexol  (OMNIPAQUE ) 350 MG/ML injection COMPARISON: None available. CLINICAL HISTORY: Abdominal pain, acute, nonlocalized; hx new cervical polyps, abdominal fullness, cant eat/drink. FINDINGS: LOWER CHEST: No acute abnormality. LIVER: The liver is unremarkable. GALLBLADDER AND BILE DUCTS: Gallbladder is unremarkable. No biliary ductal dilatation. SPLEEN: No acute abnormality. PANCREAS: No acute abnormality. ADRENAL GLANDS: No acute abnormality. KIDNEYS, URETERS AND BLADDER: No stones in the kidneys or ureters. No hydronephrosis. No perinephric or periureteral stranding. Urinary bladder is unremarkable. GI AND BOWEL: Stomach demonstrates no acute abnormality. There is no bowel obstruction. Normal appendix. PERITONEUM AND RETROPERITONEUM: No ascites. No free air. VASCULATURE: Aorta is normal in caliber. LYMPH NODES: No lymphadenopathy. REPRODUCTIVE ORGANS: No acute abnormality. BONES AND SOFT TISSUES: No acute osseous abnormality. No focal soft tissue abnormality. IMPRESSION: 1. No acute findings in the abdomen or pelvis. Electronically signed by: Norman Gatlin MD 04/05/2024 03:44 AM EDT RP Workstation: [id number]     Procedures   Medications Ordered in the ED  lactated ringers  bolus 1,000 mL (0 mLs Intravenous Stopped 04/05/24 0451)  iohexol  (OMNIPAQUE ) 350 MG/ML injection 75 mL (75 mLs Intravenous Contrast Given 04/05/24 0316)                                    Medical Decision Making Amount and/or Complexity of Data Reviewed Labs: ordered. Radiology: ordered and independent interpretation performed. ECG/medicine  tests: ordered and independent interpretation performed.  Risk Prescription drug management.   43 year old female presenting to the ED with difficulty eating over the past 2 weeks.  States after only few bites she feels very full.  Has not had any vomiting.  Has continued to have normal bowel movements.  Does admit that PCP found some cervical polyps earlier this year but never had this followed up.  She is afebrile and nontoxic in appearance here.  Abdomen is soft and nontender.  She does not have any peritoneal signs.  She does appear weak and quite fatigued, dark circles under her eyes.  Labs were obtained without leukocytosis or electrolyte derangement.  Will add on hepatic function panel, UA, TSH.  Will also obtain CT scan.  She is given IV fluids.  4:58 AM Feeling better after IVF here.  Hepatic function panel, TSH/T4, UA, and CT without acute findings.  She does appear improved.  States she is no longer having weak spells when trying to get up.  Feel she is stable for discharge.  Discussed can follow-up with GI for ongoing work-up, may need gastric emptying study, etc.  Also recommended close GYN follow-up regarding the cervical polyps found earlier this year.  Can return here for new concerns.  Final diagnoses:  Generalized weakness  Early satiety    ED Discharge Orders     None          Jarold Olam HERO, PA-C 04/05/24 9495    Midge Golas, MD 04/05/24 [phone]

## 2024-04-05 NOTE — ED Notes (Signed)
 Back from CT
